# Patient Record
Sex: Female | Born: 1980 | Race: White | Hispanic: No | Marital: Married | Smoking: Never smoker
Health system: Southern US, Community
[De-identification: ages and names within clinical notes are randomized; demographics above are authoritative.]

## PROBLEM LIST (undated history)

## (undated) DIAGNOSIS — Z8619 Personal history of other infectious and parasitic diseases: Secondary | ICD-10-CM

## (undated) DIAGNOSIS — N399 Disorder of urinary system, unspecified: Secondary | ICD-10-CM

## (undated) DIAGNOSIS — A048 Other specified bacterial intestinal infections: Secondary | ICD-10-CM

## (undated) HISTORY — DX: Other specified bacterial intestinal infections: A04.8

## (undated) HISTORY — DX: Personal history of other infectious and parasitic diseases: Z86.19

## (undated) HISTORY — PX: WISDOM TOOTH EXTRACTION: SHX21

---

## 2014-09-28 ENCOUNTER — Ambulatory Visit: Payer: Self-pay | Admitting: Unknown Physician Specialty

## 2014-09-30 ENCOUNTER — Ambulatory Visit: Payer: Self-pay | Admitting: Unknown Physician Specialty

## 2014-12-06 ENCOUNTER — Encounter (HOSPITAL_COMMUNITY): Payer: Self-pay

## 2014-12-06 ENCOUNTER — Emergency Department (HOSPITAL_COMMUNITY)
Admission: EM | Admit: 2014-12-06 | Discharge: 2014-12-06 | Disposition: A | Discharge status: Home / Self Care | Payer: BLUE CROSS/BLUE SHIELD | Source: Home / Self Care | Attending: Emergency Medicine | Admitting: Emergency Medicine

## 2014-12-06 DIAGNOSIS — R300 Dysuria: Secondary | ICD-10-CM

## 2014-12-06 DIAGNOSIS — R350 Frequency of micturition: Secondary | ICD-10-CM

## 2014-12-06 HISTORY — DX: Disorder of urinary system, unspecified: N39.9

## 2014-12-06 LAB — POCT URINALYSIS DIP (DEVICE)
BILIRUBIN URINE: NEGATIVE
Glucose, UA: NEGATIVE mg/dL
LEUKOCYTES UA: NEGATIVE
Nitrite: NEGATIVE
PH: 6 (ref 5.0–8.0)
PROTEIN: NEGATIVE mg/dL
Urobilinogen, UA: 0.2 mg/dL (ref 0.0–1.0)

## 2014-12-06 MED ORDER — CEPHALEXIN 500 MG PO CAPS: 500.0000 mg | ORAL_CAPSULE | Freq: Four times a day (QID) | ORAL | Status: DC

## 2014-12-06 NOTE — Discharge Instructions (Signed)
Dysuria °Dysuria is the medical term for pain with urination. There are many causes for dysuria, but urinary tract infection is the most common. If a urinalysis was performed it can show that there is a urinary tract infection. A urine culture confirms that you or your child is sick. You will need to follow up with a healthcare provider because: °· If a urine culture was done you will need to know the culture results and treatment recommendations. °· If the urine culture was positive, you or your child will need to be put on antibiotics or know if the antibiotics prescribed are the right antibiotics for your urinary tract infection. °· If the urine culture is negative (no urinary tract infection), then other causes may need to be explored or antibiotics need to be stopped. °Today laboratory work may have been done and there does not seem to be an infection. If cultures were done they will take at least 24 to 48 hours to be completed. °Today x-rays may have been taken and they read as normal. No cause can be found for the problems. The x-rays may be re-read by a radiologist and you will be contacted if additional findings are made. °You or your child may have been put on medications to help with this problem until you can see your primary caregiver. If the problems get better, see your primary caregiver if the problems return. If you were given antibiotics (medications which kill germs), take all of the mediations as directed for the full course of treatment.  °If laboratory work was done, you need to find the results. Leave a telephone number where you can be reached. If this is not possible, make sure you find out how you are to get test results. °HOME CARE INSTRUCTIONS  °· Drink lots of fluids. For adults, drink eight, 8 ounce glasses of clear juice or water a day. For children, replace fluids as suggested by your caregiver. °· Empty the bladder often. Avoid holding urine for long periods of time. °· After a bowel  movement, women should cleanse front to back, using each tissue only once. °· Empty your bladder before and after sexual intercourse. °· Take all the medicine given to you until it is gone. You may feel better in a few days, but TAKE ALL MEDICINE. °· Avoid caffeine, tea, alcohol and carbonated beverages, because they tend to irritate the bladder. °· In men, alcohol may irritate the prostate. °· Only take over-the-counter or prescription medicines for pain, discomfort, or fever as directed by your caregiver. °· If your caregiver has given you a follow-up appointment, it is very important to keep that appointment. Not keeping the appointment could result in a chronic or permanent injury, pain, and disability. If there is any problem keeping the appointment, you must call back to this facility for assistance. °SEEK IMMEDIATE MEDICAL CARE IF:  °· Back pain develops. °· A fever develops. °· There is nausea (feeling sick to your stomach) or vomiting (throwing up). °· Problems are no better with medications or are getting worse. °MAKE SURE YOU:  °· Understand these instructions. °· Will watch your condition. °· Will get help right away if you are not doing well or get worse. °Document Released: 05/11/2004 Document Revised: 11/05/2011 Document Reviewed: 03/18/2008 °[employer]® Patient Information ©2015 [employer]. This information is not intended to replace advice given to you by your health care provider. Make sure you discuss any questions you have with your health care provider. ° °Urinary Frequency °The   number of times a normal person urinates depends upon how much liquid they take in and how much liquid they are losing. If the temperature is hot and there is high humidity, then the person will sweat more and usually breathe a little more frequently. These factors decrease the amount of frequency of urination that would be considered normal. The amount you drink is easily determined, but the amount of fluid lost is  sometimes more difficult to calculate.  Fluid is lost in two ways:  Sensible fluid loss is usually measured by the amount of urine that you get rid of. Losses of fluid can also occur with diarrhea.  Insensible fluid loss is more difficult to measure. It is caused by evaporation. Insensible loss of fluid occurs through breathing and sweating. It usually ranges from a little less than a quart to a little more than a quart of fluid a day. In normal temperatures and activity levels, the average person may urinate 4 to 7 times in a 24-hour period. Needing to urinate more often than that could indicate a problem. If one urinates 4 to 7 times in 24 hours and has large volumes each time, that could indicate a different problem from one who urinates 4 to 7 times a day and has small volumes. The time of urinating is also important. Most urinating should be done during the waking hours. Getting up at night to urinate frequently can indicate some problems. CAUSES  The bladder is the organ in your lower abdomen that holds urine. Like a balloon, it swells some as it fills up. Your nerves sense this and tell you it is time to head for the bathroom. There are a number of reasons that you might feel the need to urinate more often than usual. They include:  Urinary tract infection. This is usually associated with other signs such as burning when you urinate.  In men, problems with the prostate (a walnut-size gland that is located near the tube that carries urine out of your body). There are two reasons why the prostate can cause an increased frequency of urination:  An enlarged prostate that does not let the bladder empty well. If the bladder only half empties when you urinate, then it only has half the capacity to fill before you have to urinate again.  The nerves in the bladder become more hypersensitive with an increased size of the prostate even if the bladder empties completely.  Pregnancy.  Obesity. Excess  weight is more likely to cause a problem for women than for men.  Bladder stones or other bladder problems.  Caffeine.  Alcohol.  Medications. For example, drugs that help the body get rid of extra fluid (diuretics) increase urine production. Some other medicines must be taken with lots of fluids.  Muscle or nerve weakness. This might be the result of a spinal cord injury, a stroke, multiple sclerosis, or Parkinson disease.  Long-standing diabetes can decrease the sensation of the bladder. This loss of sensation makes it harder to sense the bladder needs to be emptied. Over a period of years, the bladder is stretched out by constant overfilling. This weakens the bladder muscles so that the bladder does not empty well and has less capacity to fill with new urine.  Interstitial cystitis (also called painful bladder syndrome). This condition develops because the tissues that line the inside of the bladder are inflamed (inflammation is the body's way of reacting to injury or infection). It causes pain and frequent urination. It  occurs in women more often than in men. DIAGNOSIS   To decide what might be causing your urinary frequency, your health care provider will probably:  Ask about symptoms you have noticed.  Ask about your overall health. This will include questions about any medications you are taking.  Do a physical examination.  Order some tests. These might include:  A blood test to check for diabetes or other health issues that could be contributing to the problem.  Urine testing. This could measure the flow of urine and the pressure on the bladder.  A test of your neurological system (the brain, spinal cord, and nerves). This is the system that senses the need to urinate.  A bladder test to check whether it is emptying completely when you urinate.  Cystoscopy. This test uses a thin tube with a tiny camera on it. It offers a look inside your urethra and bladder to see if there  are problems.  Imaging tests. You might be given a contrast dye and then asked to urinate. X-rays are taken to see how your bladder is working. TREATMENT  It is important for you to be evaluated to determine if the amount or frequency that you have is unusual or abnormal. If it is found to be abnormal, the cause should be determined and this can usually be found out easily. Depending upon the cause, treatment could include medication, stimulation of the nerves, or surgery. There are not too many things that you can do as an individual to change your urinary frequency. It is important that you balance the amount of fluid intake needed to compensate for your activity and the temperature. Medical problems will be diagnosed and taken care of by your physician. There is no particular bladder training such as Kegel exercises that you can do to help urinary frequency. This is an exercise that is usually recommended for people who have leaking of urine when they laugh, cough, or sneeze. HOME CARE INSTRUCTIONS   Take any medications your health care provider prescribed or suggested. Follow the directions carefully.  Practice any lifestyle changes that are recommended. These might include:  Drinking less fluid or drinking at different times of the day. If you need to urinate often during the night, for example, you may need to stop drinking fluids early in the evening.  Cutting down on caffeine or alcohol. They both can make you need to urinate more often than normal. Caffeine is found in coffee, tea, and sodas.  Losing weight, if that is recommended.  Keep a journal or a log. You might be asked to record how much you drink and when and where you feel the need to urinate. This will also help evaluate how well the treatment provided by your physician is working. SEEK MEDICAL CARE IF:   Your need to urinate often gets worse.  You feel increased pain or irritation when you urinate.  You notice blood in  your urine.  You have questions about any medications that your health care provider recommended.  You notice blood, pus, or swelling at the site of any test or treatment procedure.  You develop a fever of more than 100.31F (38.1C). SEEK IMMEDIATE MEDICAL CARE IF:  You develop a fever of more than 102.66F (38.9C). Document Released: 06/09/2009 Document Revised: 12/28/2013 Document Reviewed: 06/09/2009 [facility] Patient Information 2015 [place], [place]. This information is not intended to replace advice given to you by your health care provider. Make sure you discuss any questions you have with  your health care provider.  Interstitial Cystitis Interstitial cystitis (IC) is a condition that results in discomfort or pain in the bladder and the surrounding pelvic region. The symptoms can be different from case to case and even in the same individual. People may experience:  Mild discomfort.  Pressure.  Tenderness.  Intense pain in the bladder and pelvic area. CAUSES  Because IC varies so much in symptoms and severity, people studying this disease believe it is not one but several diseases. Some caregivers use the term painful bladder syndrome (PBS) to describe cases with painful urinary symptoms. This may not meet the strictest definition of IC. The term IC / PBS includes all cases of urinary pain that cannot be connected to other causes, such as infection or urinary stones.  SYMPTOMS  Symptoms may include:  An urgent need to urinate.  A frequent need to urinate.  A combination of these symptoms. Pain may change in intensity as the bladder fills with urine or as it empties. Women's symptoms often get worse during menstruation. They may sometimes experience pain with vaginal intercourse. Some of the symptoms of IC / PBS seem like those of bacterial infection. Tests do not show infection. IC / PBS is far more common in women than in men.  DIAGNOSIS  The diagnosis of IC / PBS is based  on:  Presence of pain related to the bladder, usually along with problems of frequency and urgency.  Not finding other diseases that could cause the symptoms.  Diagnostic tests that help rule out other diseases include:  Urinalysis.  Urine culture.  Cystoscopy.  Biopsy of the bladder wall.  Distension of the bladder under anesthesia.  Urine cytology.  Laboratory examination of prostate secretions. A biopsy is a tissue sample that can be looked at under a microscope. Samples of the bladder and urethra may be removed during a cystoscopy. A biopsy helps rule out bladder cancer. TREATMENT  Scientists have not yet found a cure for IC / PBS. Patients with IC / PBS do not get better with antibiotic therapy. Caregivers cannot predict who will respond best to which treatment. Symptoms may disappear without explanation. Disappearing symptoms may coincide with an event such as a change in diet or treatment. Even when symptoms disappear, they may return after days, weeks, months, or years.  Because the causes of IC / PBS are unknown, current treatments are aimed at relieving symptoms. Many people are helped by one or a combination of the treatments. As researchers learn more about IC / PBS, the list of potential treatments will change. Patients should discuss their options with a caregiver. SURGERY  Surgery should be considered only if all available treatments have failed and the pain is disabling. Many approaches and techniques are used. Each approach has its own advantages and complications. Advantages and complications should be discussed with a urologist. Your caregiver may recommend consulting another urologist for a second opinion. Most caregivers are reluctant to operate because the outcome is unpredictable. Some people still have symptoms after surgery.  People considering surgery should discuss the potential risks and benefits, side effects, and long- and short-term complications with their  family, as well as with people who have already had the procedure. Surgery requires anesthesia, hospitalization, and in some cases weeks or months of recovery. As the complexity of the procedure increases, so do the chances for complications and for failure. HOME CARE INSTRUCTIONS   All drugs, even those sold over the counter, have side effects. Patients should always  consult a caregiver before using any drug for an extended amount of time. Only take over-the-counter or prescription medicines for pain, discomfort, or fever as directed by your caregiver.  Many patients feel that smoking makes their symptoms worse. How the by-products of tobacco that are excreted in the urine affect IC / PBS is unknown. Smoking is the major known cause of bladder cancer. One of the best things smokers can do for their bladder and their overall health is to quit.  Many patients feel that gentle stretching exercises help relieve IC / PBS symptoms.  Methods vary, but basically patients decide to empty their bladder at designated times and use relaxation techniques and distractions to keep to the schedule. Gradually, patients try to lengthen the time between scheduled voids. A diary in which to record voiding times is usually helpful in keeping track of progress. MAKE SURE YOU:   Understand these instructions.  Will watch your condition.  Will get help right away if you are not doing well or get worse. Document Released: 04/13/2004 Document Revised: 11/05/2011 Document Reviewed: 06/28/2008 [facility] Patient Information 2015 [place], [place]. This information is not intended to replace advice given to you by your health care provider. Make sure you discuss any questions you have with your health care provider.

## 2014-12-06 NOTE — ED Provider Notes (Signed)
CSN: [id number]     Arrival date & time 12/06/14  1322 History   First MD Initiated Contact with Patient 12/06/14 1518     Chief Complaint  Patient presents with  . Urinary Tract Infection   (Consider location/radiation/quality/duration/timing/severity/associated sxs/prior Treatment) HPI Comments: 34 year old females complaining of dysuria, frequency and urgency that began this morning. She states she has had multiple UTIs, well over 100. She states that each time that her doctor sees blood in the urine and she has a symptom she is treated with antibiotics. She also notes that her cultures are usually negative. Patient is pregnant.   Past Medical History  Diagnosis Date  . Urinary tract disease    History reviewed. No pertinent past surgical history. History reviewed. No pertinent family history. History  Substance Use Topics  . Smoking status: Never Smoker   . Smokeless tobacco: Not on file  . Alcohol Use: Yes   OB History    Gravida Para Term Preterm AB TAB SAB Ectopic Multiple Living   1              Review of Systems  Constitutional: Negative.   Cardiovascular: Negative.   Gastrointestinal: Negative.   Genitourinary: Positive for dysuria, urgency and frequency. Negative for flank pain.  Skin: Negative.   All other systems reviewed and are negative.   Allergies  Review of patient's allergies indicates no known allergies.  Home Medications   Prior to Admission medications   Medication Sig Start Date End Date Taking? Authorizing Provider  multivitamin prental (TRINATAL) 60-1 MG TABS tablet Take 1 tablet by mouth daily.   Yes Historical Provider, MD  cephALEXin (KEFLEX) 500 MG capsule Take 1 capsule (500 mg total) by mouth 4 (four) times daily. 12/06/14   Hayden Rasmussenavid Janathan Bribiesca, NP   BP 111/73 mmHg  Pulse 78  Temp(Src) 98 F (36.7 C) (Oral)  Resp 12  SpO2 100%  LMP 10/15/2014 Physical Exam  Constitutional: She is oriented to person, place, and time. She appears  well-developed and well-nourished. No distress.  Pulmonary/Chest: Effort normal. No respiratory distress.  Abdominal: Soft. Bowel sounds are normal.  Neurological: She is alert and oriented to person, place, and time.  Skin: Skin is warm and dry.  Psychiatric: She has a normal mood and affect.  Nursing note and vitals reviewed.   ED Course  Procedures (including critical care time) Labs Review Labs Reviewed  POCT URINALYSIS DIP (DEVICE) - Abnormal; Notable for the following:    Ketones, ur TRACE (*)    Hgb urine dipstick MODERATE (*)    All other components within normal limits  URINE CULTURE   Results for orders placed or performed during the hospital encounter of 12/06/14  POCT urinalysis dip (device)  Result Value Ref Range   Glucose, UA NEGATIVE NEGATIVE mg/dL   Bilirubin Urine NEGATIVE NEGATIVE   Ketones, ur TRACE (A) NEGATIVE mg/dL   Specific Gravity, Urine >=1.030 1.005 - 1.030   Hgb urine dipstick MODERATE (A) NEGATIVE   pH 6.0 5.0 - 8.0   Protein, ur NEGATIVE NEGATIVE mg/dL   Urobilinogen, UA 0.2 0.0 - 1.0 mg/dL   Nitrite NEGATIVE NEGATIVE   Leukocytes, UA NEGATIVE NEGATIVE     Imaging Review No results found.   MDM   1. Dysuria   2. Urinary frequency    Keflex Urine culture pending. Recommend pt see urologist. Other consideration is IC    Hayden Rasmussenavid Courtni Balash, NP 12/06/14 1621

## 2014-12-06 NOTE — ED Notes (Signed)
Concern for another UTI; reported history of frequent UTI since onset puberty

## 2014-12-08 LAB — URINE CULTURE
Colony Count: 50000
Special Requests: NORMAL

## 2014-12-08 NOTE — ED Notes (Addendum)
Urine culture: 50,000 colonies Lactobacillus.  Pt. adequately treated with Keflex. Message to Hayden Rasmussenavid Mabe NP. Vassie MoselleYork, Askia Hazelip M 12/08/2014 He wrote "possible contaminant"- should be fine. 12/13/2014

## 2014-12-29 LAB — OB RESULTS CONSOLE ABO/RH: RH Type: POSITIVE

## 2014-12-29 LAB — OB RESULTS CONSOLE RPR: RPR: NONREACTIVE

## 2014-12-29 LAB — OB RESULTS CONSOLE ANTIBODY SCREEN: Antibody Screen: NEGATIVE

## 2014-12-29 LAB — OB RESULTS CONSOLE GC/CHLAMYDIA
Chlamydia: NEGATIVE
Gonorrhea: NEGATIVE

## 2014-12-29 LAB — OB RESULTS CONSOLE HIV ANTIBODY (ROUTINE TESTING): HIV: NONREACTIVE

## 2014-12-29 LAB — OB RESULTS CONSOLE HEPATITIS B SURFACE ANTIGEN: HEP B S AG: NEGATIVE

## 2014-12-29 LAB — OB RESULTS CONSOLE RUBELLA ANTIBODY, IGM: Rubella: IMMUNE

## 2015-06-06 ENCOUNTER — Telehealth (HOSPITAL_COMMUNITY): Payer: Self-pay | Admitting: *Deleted

## 2015-06-06 ENCOUNTER — Encounter (HOSPITAL_COMMUNITY): Payer: Self-pay | Admitting: *Deleted

## 2015-06-06 LAB — OB RESULTS CONSOLE GBS: GBS: NEGATIVE

## 2015-06-06 NOTE — Telephone Encounter (Signed)
Preadmission screen  

## 2015-06-07 ENCOUNTER — Other Ambulatory Visit: Payer: Self-pay | Admitting: Obstetrics and Gynecology

## 2015-06-08 ENCOUNTER — Encounter (HOSPITAL_COMMUNITY): Payer: Self-pay

## 2015-06-08 ENCOUNTER — Inpatient Hospital Stay (HOSPITAL_COMMUNITY): Payer: BLUE CROSS/BLUE SHIELD | Admitting: Anesthesiology

## 2015-06-08 ENCOUNTER — Inpatient Hospital Stay (HOSPITAL_COMMUNITY)
Admission: RE | Admit: 2015-06-08 | Discharge: 2015-06-10 | DRG: 775 | Disposition: A | Discharge status: Home / Self Care | Payer: BLUE CROSS/BLUE SHIELD | Source: Ambulatory Visit | Attending: Obstetrics and Gynecology | Admitting: Obstetrics and Gynecology

## 2015-06-08 VITALS — BP 116/79 | HR 80 | Temp 97.8°F | Resp 16 | Ht 63.5 in | Wt 129.8 lb

## 2015-06-08 DIAGNOSIS — O365930 Maternal care for other known or suspected poor fetal growth, third trimester, not applicable or unspecified: Principal | ICD-10-CM | POA: Diagnosis present

## 2015-06-08 DIAGNOSIS — Z3490 Encounter for supervision of normal pregnancy, unspecified, unspecified trimester: Secondary | ICD-10-CM

## 2015-06-08 DIAGNOSIS — Z3A39 39 weeks gestation of pregnancy: Secondary | ICD-10-CM

## 2015-06-08 LAB — CBC
HCT: 36.3 % (ref 36.0–46.0)
Hemoglobin: 12.6 g/dL (ref 12.0–15.0)
MCH: 31.1 pg (ref 26.0–34.0)
MCHC: 34.7 g/dL (ref 30.0–36.0)
MCV: 89.6 fL (ref 78.0–100.0)
PLATELETS: 232 10*3/uL (ref 150–400)
RBC: 4.05 MIL/uL (ref 3.87–5.11)
RDW: 12.6 % (ref 11.5–15.5)
WBC: 10.4 10*3/uL (ref 4.0–10.5)

## 2015-06-08 LAB — TYPE AND SCREEN
ABO/RH(D): A POS
Antibody Screen: NEGATIVE

## 2015-06-08 LAB — RPR: RPR: NONREACTIVE

## 2015-06-08 LAB — ABO/RH: ABO/RH(D): A POS

## 2015-06-08 MED ORDER — EPHEDRINE 5 MG/ML INJ: 10.0000 mg | INTRAVENOUS | Status: DC | PRN

## 2015-06-08 MED ORDER — SIMETHICONE 80 MG PO CHEW: 80.0000 mg | CHEWABLE_TABLET | ORAL | Status: DC | PRN

## 2015-06-08 MED ORDER — OXYCODONE-ACETAMINOPHEN 5-325 MG PO TABS: 2.0000 | ORAL_TABLET | ORAL | Status: DC | PRN

## 2015-06-08 MED ORDER — LIDOCAINE HCL (PF) 1 % IJ SOLN: 30.0000 mL | INTRAMUSCULAR | Status: DC | PRN

## 2015-06-08 MED ORDER — OXYTOCIN 40 UNITS IN LACTATED RINGERS INFUSION - SIMPLE MED: 62.5000 mL/h | INTRAVENOUS | Status: AC

## 2015-06-08 MED ORDER — OXYTOCIN BOLUS FROM INFUSION
500.0000 mL | INTRAVENOUS | Status: DC
  Administered 2015-06-08: 500 mL via INTRAVENOUS

## 2015-06-08 MED ORDER — PHENYLEPHRINE 40 MCG/ML (10ML) SYRINGE FOR IV PUSH (FOR BLOOD PRESSURE SUPPORT)
80.0000 ug | PREFILLED_SYRINGE | INTRAVENOUS | Status: DC | PRN
  Filled 2015-06-08: qty 20

## 2015-06-08 MED ORDER — OXYCODONE-ACETAMINOPHEN 5-325 MG PO TABS: 1.0000 | ORAL_TABLET | ORAL | Status: DC | PRN

## 2015-06-08 MED ORDER — ONDANSETRON HCL 4 MG PO TABS: 4.0000 mg | ORAL_TABLET | ORAL | Status: DC | PRN

## 2015-06-08 MED ORDER — DIBUCAINE 1 % RE OINT: 1.0000 "application " | TOPICAL_OINTMENT | RECTAL | Status: DC | PRN

## 2015-06-08 MED ORDER — LACTATED RINGERS IV SOLN: INTRAVENOUS | Status: AC

## 2015-06-08 MED ORDER — ONDANSETRON HCL 4 MG/2ML IJ SOLN: 4.0000 mg | INTRAMUSCULAR | Status: DC | PRN

## 2015-06-08 MED ORDER — LIDOCAINE HCL (PF) 1 % IJ SOLN
INTRAMUSCULAR | Status: DC | PRN
  Administered 2015-06-08 (×2): 4 mL

## 2015-06-08 MED ORDER — DIPHENHYDRAMINE HCL 25 MG PO CAPS
25.0000 mg | ORAL_CAPSULE | Freq: Four times a day (QID) | ORAL | Status: DC | PRN
  Administered 2015-06-10: 25 mg via ORAL
  Filled 2015-06-08: qty 1

## 2015-06-08 MED ORDER — ZOLPIDEM TARTRATE 5 MG PO TABS: 5.0000 mg | ORAL_TABLET | Freq: Every evening | ORAL | Status: DC | PRN

## 2015-06-08 MED ORDER — FENTANYL 2.5 MCG/ML BUPIVACAINE 1/10 % EPIDURAL INFUSION (WH - ANES)
14.0000 mL/h | INTRAMUSCULAR | Status: DC | PRN
  Administered 2015-06-08 (×2): 14 mL/h via EPIDURAL
  Filled 2015-06-08: qty 125

## 2015-06-08 MED ORDER — SENNOSIDES-DOCUSATE SODIUM 8.6-50 MG PO TABS
2.0000 | ORAL_TABLET | ORAL | Status: DC
  Administered 2015-06-08 – 2015-06-10 (×2): 2 via ORAL
  Filled 2015-06-08 (×2): qty 2

## 2015-06-08 MED ORDER — PRENATAL MULTIVITAMIN CH
1.0000 | ORAL_TABLET | Freq: Every day | ORAL | Status: DC
  Administered 2015-06-09 – 2015-06-10 (×2): 1 via ORAL
  Filled 2015-06-08 (×2): qty 1

## 2015-06-08 MED ORDER — LACTATED RINGERS IV SOLN
INTRAVENOUS | Status: DC
  Administered 2015-06-08: 125 mL/h via INTRAVENOUS

## 2015-06-08 MED ORDER — OXYTOCIN 40 UNITS IN LACTATED RINGERS INFUSION - SIMPLE MED
1.0000 m[IU]/min | INTRAVENOUS | Status: DC
  Administered 2015-06-08: 1 m[IU]/min via INTRAVENOUS
  Filled 2015-06-08: qty 1000

## 2015-06-08 MED ORDER — DIPHENHYDRAMINE HCL 50 MG/ML IJ SOLN: 12.5000 mg | INTRAMUSCULAR | Status: DC | PRN

## 2015-06-08 MED ORDER — WITCH HAZEL-GLYCERIN EX PADS: 1.0000 "application " | MEDICATED_PAD | CUTANEOUS | Status: DC | PRN

## 2015-06-08 MED ORDER — LANOLIN HYDROUS EX OINT: TOPICAL_OINTMENT | CUTANEOUS | Status: DC | PRN

## 2015-06-08 MED ORDER — BENZOCAINE-MENTHOL 20-0.5 % EX AERO
1.0000 "application " | INHALATION_SPRAY | CUTANEOUS | Status: DC | PRN
  Administered 2015-06-08: 1 via TOPICAL
  Filled 2015-06-08: qty 56

## 2015-06-08 MED ORDER — TETANUS-DIPHTH-ACELL PERTUSSIS 5-2.5-18.5 LF-MCG/0.5 IM SUSP: 0.5000 mL | Freq: Once | INTRAMUSCULAR | Status: DC

## 2015-06-08 MED ORDER — ACETAMINOPHEN 325 MG PO TABS: 650.0000 mg | ORAL_TABLET | ORAL | Status: DC | PRN

## 2015-06-08 MED ORDER — SODIUM CHLORIDE 0.9 % IV SOLN: 14.0000 mL/h | INTRAVENOUS | Status: DC | PRN

## 2015-06-08 MED ORDER — OXYTOCIN 40 UNITS IN LACTATED RINGERS INFUSION - SIMPLE MED
62.5000 mL/h | INTRAVENOUS | Status: DC
Start: 2015-06-08 — End: 2015-06-08

## 2015-06-08 MED ORDER — IBUPROFEN 600 MG PO TABS
600.0000 mg | ORAL_TABLET | Freq: Four times a day (QID) | ORAL | Status: DC
  Administered 2015-06-08 – 2015-06-10 (×7): 600 mg via ORAL
  Filled 2015-06-08 (×7): qty 1

## 2015-06-08 MED ORDER — MEASLES, MUMPS & RUBELLA VAC ~~LOC~~ INJ: 0.5000 mL | INJECTION | Freq: Once | SUBCUTANEOUS | Status: DC

## 2015-06-08 MED ORDER — PRENATAL MULTIVITAMIN CH: 1.0000 | ORAL_TABLET | Freq: Every day | ORAL | Status: DC

## 2015-06-08 MED ORDER — LACTATED RINGERS IV SOLN
500.0000 mL | INTRAVENOUS | Status: DC | PRN
  Administered 2015-06-08: 500 mL via INTRAVENOUS

## 2015-06-08 NOTE — Progress Notes (Signed)
Patient ID: Monica [name], female   DOB: 09/23/1980, 34 y.o.   MRN: [id number] Pitocin is at 3 mu/minute Contractions are q 3-5 minutes and are more painful. Cervix is 2 + cm 70 % effaced and the vertex is at - 2 station. AROM produced clear fluid.

## 2015-06-08 NOTE — Anesthesia Procedure Notes (Signed)
Epidural Patient location during procedure: OB  Staffing Anesthesiologist: Kitai Purdom Performed by: anesthesiologist   Preanesthetic Checklist Completed: patient identified, site marked, surgical consent, pre-op evaluation, timeout performed, IV checked, risks and benefits discussed and monitors and equipment checked  Epidural Patient position: sitting Prep: site prepped and draped and DuraPrep Patient monitoring: continuous pulse ox and blood pressure Approach: midline Location: L3-L4 Injection technique: LOR saline  Needle:  Needle type: Tuohy  Needle gauge: 17 G Needle length: 9 cm and 9 Needle insertion depth: 6 cm Catheter type: closed end flexible Catheter size: 19 Gauge Catheter at skin depth: 10 cm Test dose: negative  Assessment Events: blood not aspirated, injection not painful, no injection resistance, negative IV test and no paresthesia  Additional Notes Patient identified. Risks/Benefits/Options discussed with patient including but not limited to bleeding, infection, nerve damage, paralysis, failed block, incomplete pain control, headache, blood pressure changes, nausea, vomiting, reactions to medication both or allergic, itching and postpartum back pain. Confirmed with bedside nurse the patient's most recent platelet count. Confirmed with patient that they are not currently taking any anticoagulation, have any bleeding history or any family history of bleeding disorders. Patient expressed understanding and wished to proceed. All questions were answered. Sterile technique was used throughout the entire procedure. Please see nursing notes for vital signs. Test dose was given through epidural catheter and negative prior to continuing to dose epidural or start infusion. Warning signs of high block given to the patient including shortness of breath, tingling/numbness in hands, complete motor block, or any concerning symptoms with instructions to call for help. Patient was  given instructions on fall risk and not to get out of bed. All questions and concerns addressed with instructions to call with any issues or inadequate analgesia.    

## 2015-06-08 NOTE — Anesthesia Postprocedure Evaluation (Signed)
  Anesthesia Post-op Note  Patient: Monica MoynahanSara Elizabeth [name]  Procedure(s) Performed: * No procedures listed *  Patient Location: Mother/Baby  Anesthesia Type:Epidural  Level of Consciousness: awake, alert  and oriented  Airway and Oxygen Therapy: Patient Spontanous Breathing  Post-op Pain: none  Post-op Assessment: Post-op Vital signs reviewed and Patient's Cardiovascular Status Stable              Post-op Vital Signs: Reviewed and stable  Last Vitals:  Filed Vitals:   06/08/15 1530  BP: 120/77  Pulse: 79  Temp: 36.5 C  Resp: 17    Complications: No apparent anesthesia complications

## 2015-06-08 NOTE — Anesthesia Preprocedure Evaluation (Signed)
Anesthesia Evaluation  Patient identified by MRN, date of birth, ID band Patient awake    Reviewed: Allergy & Precautions, NPO status , Patient's Chart, lab work & pertinent test results  History of Anesthesia Complications Negative for: history of anesthetic complications  Airway Mallampati: II  TM Distance: >3 FB Neck ROM: Full    Dental no notable dental hx. (+) Dental Advisory Given   Pulmonary neg pulmonary ROS,    Pulmonary exam normal breath sounds clear to auscultation       Cardiovascular negative cardio ROS Normal cardiovascular exam Rhythm:Regular Rate:Normal     Neuro/Psych negative neurological ROS  negative psych ROS   GI/Hepatic negative GI ROS, Neg liver ROS,   Endo/Other  negative endocrine ROS  Renal/GU negative Renal ROS  negative genitourinary   Musculoskeletal negative musculoskeletal ROS (+)   Abdominal   Peds negative pediatric ROS (+)  Hematology negative hematology ROS (+)   Anesthesia Other Findings   Reproductive/Obstetrics (+) Pregnancy                             Anesthesia Physical Anesthesia Plan  ASA: II  Anesthesia Plan: Epidural   Post-op Pain Management:    Induction:   Airway Management Planned:   Additional Equipment:   Intra-op Plan:   Post-operative Plan:   Informed Consent: I have reviewed the patients History and Physical, chart, labs and discussed the procedure including the risks, benefits and alternatives for the proposed anesthesia with the patient or authorized representative who has indicated his/her understanding and acceptance.     Plan Discussed with: CRNA  Anesthesia Plan Comments:         Anesthesia Quick Evaluation  

## 2015-06-08 NOTE — Progress Notes (Signed)
Patient ID: Monica HughSara Elizabeth [name], female   DOB: 08/12/1981, 34 y.o.   MRN: [id number] Pt is admitted for induction Contractions are q 5 minutes and not painful. BP is borderline elevated. She has no headache.

## 2015-06-08 NOTE — Lactation Note (Signed)
This note was copied from the chart of Monica [name]. Lactation Consultation Note  Patient Name: Monica [name] ZOXWR'UToday's Date: 06/08/2015 Reason for consult: Initial assessment First time mom that wants to breast and formula feed. She has a latch score of 10 and has not offered formula yet. She stated she is not sure she will make milk, worried the baby may stop latching, wants FOB to feed the baby, she plans to go back to work at 6 wk, and she has a pump but thinks her job will make pumping hard. Her bf goal is 2-323m. Upon entry baby had a pacifier in his mouth. Went over artificial nipples, feeding cues/frequency, milk transition, nipple care, and breast changes. She stated that she know how to manually express, but did not demonstrate. Mom reports she has seen colostrum.  She is aware of Labor Laws. Informed family of lactation services and support group. She has no questions and will page lactation if needed.    Maternal Data Has patient been taught Hand Expression?: Yes Does the patient have breastfeeding experience prior to this delivery?: No  Feeding Feeding Type: Breast Fed Length of feed: 30 min  LATCH Score/Interventions Latch: Grasps breast easily, tongue down, lips flanged, rhythmical sucking.  Audible Swallowing: Spontaneous and intermittent  Type of Nipple: Everted at rest and after stimulation  Comfort (Breast/Nipple): Soft / non-tender     Hold (Positioning): No assistance needed to correctly position infant at breast.  LATCH Score: 10  Lactation Tools Discussed/Used     Consult Status Consult Status: PRN    Monica [name] 06/08/2015, 4:34 PM

## 2015-06-08 NOTE — Progress Notes (Signed)
Patient ID: Monica HughSara Elizabeth [name], female   DOB: 07/27/1981, 34 y.o.   MRN: [id number] Delivery note:  The pt made good progress,received an epidural and reached full dilatation. She pushed well and gradually brought the vertex to the perineum and delivered spontaneously OA a living female infant with Apgars of 9 and 9 at 1 and 5 minutes. The placenta delivered intact and the uterus was normal. There was a right labial and a left of ML second degree laceration. They were repaired with 3-0 vicryl. After the repair of the perineal laceration, I found I had sutured some right vaginal mucosa to the perineum so I removed the suture and resutured it.  EBL 300 cc's. The placenta appeared normal so was not sent to path

## 2015-06-08 NOTE — H&P (Signed)
Monica [name]:  NORLING, Barri                ACCOUNT NO.:  [id number]  MEDICAL RECORD NO.:  [id number]  LOCATION:  9172                          FACILITY:  [facility]  PHYSICIAN:  Malachi Prohomas F. Ambrose MantleHenley, M.D. DATE OF BIRTH:  23-Mar-1981  DATE OF ADMISSION:  06/08/2015 DATE OF DISCHARGE:                             HISTORY & PHYSICAL   HISTORY OF PRESENT ILLNESS:  This is a 34 year old white female, para 0, gravida 1, EDC June 12, 2015, admitted for induction of labor because of favorable cervix and probable small for gestational age.  Blood group and type A positive, negative antibody.  RPR negative.  Urine culture negative.  Hepatitis B surface antigen negative, HIV negative, GC and Chlamydia negative.  Varicella immune.  Rubella immune.  One-hour Glucola test 135.  Three-hour GTT not recommended by Dr. Jackelyn KnifeMeisinger. Repeat HIV and RPR negative.  Group B strep negative.  The patient began her prenatal course on Dec 29, 2014. Ultrasound done on December 16, 2014, [facility] 4 days[facility]  She declines screening tests.  The 18-week ultrasound was normal.  The Glucola test was 135. She was advised she could do the 3 hour if she wanted to, apparently, she chose not to.  At 32 weeks and 4 days, she measured 30.5, so an ultrasound was recommended for growth.  At 36 weeks, the baby seems somewhat small.  She was again asked if she wanted to repeat the blood glucose test and she declined.  Because of small for dates, nonstress tests were done twice a week.  She had a repeat ultrasound that suggested the baby was small, less than the 10th percentile.  Doppler studies and AFI were done.  I spoke to Dr. Harlon FlorWhitaker on May 30, 2015. He advised delivery at 39 weeks, not waiting for the estimated date of confinement.  Her last ultrasound on June 01, 2015, the baby had a normal AFI, Doppler studies were normal.  Her ultrasound on May 24, 2015, the estimated fetal weight was 5 pounds 8 ounces less  than the 10th percentile, but there was symmetrical growth.  The patient herself weighed 4 pounds 2 ounces at term, and she said that her entire family was small at birth.  The patient is admitted now for induction of labor.  PAST MEDICAL HISTORY:  No major adult illnesses.  She had wisdom teeth extracted.  ALLERGIES:  She has no known drug allergies.  No latex or food allergies.  SOCIAL HISTORY:  The patient never smoked, does not drink, denies drugs. Has post graduate education.  FAMILY HISTORY:  Mother cancer of the breast, father high blood pressure, unspecified relation on the paternal side high blood pressure.  PHYSICAL EXAMINATION:  On today's visit on June 07, 2015, VITAL SIGNS:  Blood pressure is 128/80, pulse is 72. HEART:  Normal size and sounds.  No murmurs. LUNGS:  Clear to auscultation. GU:  Fundal height is 34.5 cm.  Fetal heart tones are normal.  Cervix is 2 cm, 40%, vertex at a -2.  ADMITTING IMPRESSION:  Intrauterine pregnancy at 39 weeks and 3 days probably small for gestational age.  The patient is admitted and will be  placed on Pitocin.     Malachi Pro. Ambrose Mantle, M.D.     TFH/MEDQ  D:  06/07/2015  T:  06/07/2015  Job:  [id number]

## 2015-06-09 LAB — CBC
HCT: 33.4 % — ABNORMAL LOW (ref 36.0–46.0)
Hemoglobin: 11.4 g/dL — ABNORMAL LOW (ref 12.0–15.0)
MCH: 30.8 pg (ref 26.0–34.0)
MCHC: 34.1 g/dL (ref 30.0–36.0)
MCV: 90.3 fL (ref 78.0–100.0)
PLATELETS: 208 10*3/uL (ref 150–400)
RBC: 3.7 MIL/uL — ABNORMAL LOW (ref 3.87–5.11)
RDW: 12.9 % (ref 11.5–15.5)
WBC: 15.9 10*3/uL — AB (ref 4.0–10.5)

## 2015-06-09 NOTE — Progress Notes (Signed)
Patient ID: Monica [name], female   DOB: 09/10/1980, 34 y.o.   MRN: [id number] #1 afebrile BP normal no complaints

## 2015-06-09 NOTE — Lactation Note (Addendum)
This note was copied from the chart of Monica [name]. Lactation Consultation Note  Patient Name: Monica [name] ZOXWR'UToday's Date: 06/09/2015 Reason for consult: Follow-up assessment;Infant < 6lbs   Follow up consult with mom. Infant with 4+ attempts, 1 BF for 30 minutes 1 void, 1 stool and 3 % weight loss in since birth. Mom's nurse reports infant has been gaggy and spitting some. He had his circumcision this morning. Mom reports she is attempting to feed infant every 2-3 hours. She reports infant will latch, but them goes to sleep. Enc her to attempt to feed every 2-3 hours and to practice STS with and between feeds. Discussed with mom that it may be to his benefit if we hand express and spoon feed infant, mom wishes to wait until he is over 24 hours old. Will need to follow up this afternoon. Enc. Mom to keep I/O record as she is not currently using it, she does have it at bedside. Enc. Mom to call with questions/concerns.  Mom reported that she had minimal breast changes with pregnancy, " a little bit" of tenderness, growth, and darkening areola. She did not experience any breast leaking. She says she planned to Breast and formula feed so her husband can help feed. Discussed possibility of nipple confusion with introduction early. Pacifier was in infants crib currently.    Maternal Data Does the patient have breastfeeding experience prior to this delivery?: No  Feeding    LATCH Score/Interventions                      Lactation Tools Discussed/Used     Consult Status Consult Status: Follow-up Date: 06/09/15 Follow-up type: In-patient    Silas FloodSharon S Falisha Osment 06/09/2015, 11:19 AM

## 2015-06-09 NOTE — Lactation Note (Signed)
This note was copied from the chart of Monica Darylene PriceSara Norling. Lactation Consultation Note Follow up visit at 28 hours of age.  Baby remains sleepy and holds breast in mouth, but not sustaining suck.  Mom has easily expressed colostrum.  Spoon fed baby 6mls, baby began to extend tongue to lick off spoon at end of feeding and then back asleep.  Inttructed on use of NS and fit mom for #20.  Baby latched well with intermittent sucking and good strong jaw movement when stimulated for about 5 minutes.  Colostrum noted in nipple shield.  MOm is able to return demonstration of NS application, but baby too sleepy to latch again.  Encouraged mom to allow STS every 2 hours and feed with feeding cues on demand.  Encouraged mom to give supplement of her colostrum before or after feeding, this visit the appetizer seemed to help him.  Baby is still gaggy with high palate and and limited tongue mobility.  Encouraged mom to continue pumping to increase her milk supply and offer supplement to baby.  [facility].      Patient Name: Monica Darylene PriceSara Norling ZOXWR'UToday's Date: 06/09/2015 Reason for consult: Follow-up assessment;Difficult latch;Infant < 6lbs   Maternal Data Has patient been taught Hand Expression?: Yes Does the patient have breastfeeding experience prior to this delivery?: No  Feeding Feeding Type: Breast Fed Length of feed: 5 min  LATCH Score/Interventions Latch: Repeated attempts needed to sustain latch, nipple held in mouth throughout feeding, stimulation needed to elicit sucking reflex. Intervention(s): Skin to skin;Teach feeding cues;Waking techniques Intervention(s): Breast massage;Breast compression  Audible Swallowing: A few with stimulation Intervention(s): Skin to skin;Hand expression  Type of Nipple: Everted at rest and after stimulation  Comfort (Breast/Nipple): Soft / non-tender     Hold (Positioning): Assistance needed to correctly position infant at breast and maintain  latch. Intervention(s): Breastfeeding basics reviewed;Support Pillows;Position options;Skin to skin  LATCH Score: 7  Lactation Tools Discussed/Used Tools: Nipple Shields Nipple shield size: 20 Date initiated:: 06/09/15   Consult Status Consult Status: Follow-up Date: 06/10/15 Follow-up type: In-patient    Beverely RisenShoptaw, Arvella MerlesJana Lynn 06/09/2015, 5:31 PM

## 2015-06-10 ENCOUNTER — Ambulatory Visit: Payer: Self-pay

## 2015-06-10 MED ORDER — IBUPROFEN 600 MG PO TABS: 600.0000 mg | ORAL_TABLET | Freq: Four times a day (QID) | ORAL | Status: DC | PRN

## 2015-06-10 NOTE — Discharge Instructions (Signed)
booklet °

## 2015-06-10 NOTE — Lactation Note (Signed)
This note was copied from the chart of Monica [name]. Lactation Consultation Note  Patient Name: Monica [name] BJYNW'GToday's Date: 06/10/2015 Reason for consult: Follow-up assessment;Difficult latch;Infant weight loss;Infant < 6lbs   Follow up with mom prior to D/C. Mom had just BF infant at right breast with nipple shield. Infant was sleepy. Dad  Attempted to finger feed infant with 5 fr feeding tube and syringe. Infant gaggy and with disorganized suck and took 3 cc. Showed parents how to bottle feed infant with slow flow nipple using chin and cheek support. Infant did better with bottle once he got started. He took an additional 7 cc Neocare 22 formula. Mom has Dr. Manson PasseyBrown bottles at home and plans to use those once home. Mom did not pump this morning. She declined taking a pump home and said that she and dad want to see how the baby does tonight and then decide if they want to buy one. She has not called her insurance company about the pump yet. Advised mom this is not my recommendation and that she may not be able to initiate or maintain a milk supply and she replied "ok". Enc. Mom to call with questions/concerns and to call and set up OP appt if still using NS at 1 week.   Maternal Data Formula Feeding for Exclusion: No  Feeding Feeding Type: Breast Fed Length of feed: 10 min  LATCH Score/Interventions                      Lactation Tools Discussed/Used Tools: 5F feeding tube / Syringe;Bottle Nipple shield size: 20   Consult Status Consult Status: Complete Follow-up type: Call as needed    Monica BlalockSharon S Vora [name] 06/10/2015, 12:20 PM

## 2015-06-10 NOTE — Progress Notes (Signed)
Patient ID: Monica [name], female   DOB: 02/19/1981, 34 y.o.   MRN: [id number] #2 afebrile BP normal for d/c

## 2015-06-10 NOTE — Lactation Note (Signed)
This note was copied from the chart of Monica [name]. Lactation Consultation Note  Patient Name: Monica [name] ZOXWR'UToday's Date: 06/10/2015 Reason for consult: Follow-up assessment;Infant < 6lbs;Infant weight loss   Follow up with mom, dad, and infant. Baby noted to have 3 BF for 7-12 minutes, 7 attempts, 3 voids, 7 stools, and 8 % weight loss in last 24 hours. Mom reports he is more alert and active at feedings. She reports he had a cluster feeding spell yesterday evening and did not awaken to feed much during the night. Mom reports she is using the #20 nipple shield with each feeding. Mom has a DEBP at bedside, she reports she has not been  pumping. We discussed the need for awakening baby to feed every 3 hours and the need to post pump to initiate Lactation and to protect supply dues to NS use, sleepy baby, and weight loss. Mom BF fed infant while sitting in chair. Mom with small breasts and small nipples, nipple tissue not easily compressible or stretchable. 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 She placed NS on left breast independently and latched infant shallowly to NS. Infant bobbing on and off NS and had only 1/2 NS in mouth, encouraged mom to assist baby with obtaining a deeper latch to maximize milk transfer. Infant nursed on and off during a 45 minute period. He was examined by MD during middle of feeding. A few strong sucking burst with audible swallowing noted. Mom did voice that she noticed a difference when child pulled further into breast. Encouraged mom to massage/compress breasts during feeding, she would do so for a few strokes and stop. I encouraged mom to pump and hand express post feed, she declined saying "not if he is feeding like this". We again discussed supply and demand, potential for decreased supply and milk transfer related to NS use, sleepy baby, and infants weight loss and need to pump to stimulate supply. She chose not to pump. Spoke with Dr. Chestine Sporelark and gave him an  update, who spoke with family. Decision was made to supplement infant every 3 hours post BF with EBM or Neocare 22 cal formula. Formula preparation sheet with supplementation guidelines given and explaoned to family. Infant noted to have a disorganized suck when sucking on gloved finger, he would eventually get into a rhythmic sucking pattern. Worked with dad to teach finger feeding with a 5 french feeding tube and syringe, infant noted to be making a biting motion and had difficulty forming a seal to suck, I also attempted and noted the same sucking pattern. Infant better once gave chin and cheek support. Discussed with family that infant may need to be given a bottle for supplementation to be able to give chin and cheek support. Mom is to order a DEBP from [employer], encouraged her to call and order today. Gave paperwork for 2 week pump rental. Will follow up at next feeding to assess suck during supplementation prior to D/C. Advised mom that infant needs to have an OP LC consult in a week, she declined saying that she doesn't wasn't to make an appointment until after she gets home and they get in a pattern. Discussed with mom that the NS should be a temporary tool and encouraged her to try to latch infant at Breast with out it at times to be able to wean him off. Plan was to feed infant every 2-3 hours at first feeding cues or awaken at 3 hours as needed, pump with  DEBP for 15 minutes, followed by hand expression. Feed infant EBM first followed by formula amounts per guidelines by finger feeding or bottle. [facility] to [facility] morning for follow up. Will follow up at 11 am feeding to finalize D/C feeding plan.   Maternal Data Formula Feeding for Exclusion: No Has patient been taught Hand Expression?: Yes Does the patient have breastfeeding experience prior to this delivery?: No  Feeding Feeding Type: Breast Fed Length of feed: 20 min  LATCH Score/Interventions Latch: Repeated  attempts needed to sustain latch, nipple held in mouth throughout feeding, stimulation needed to elicit sucking reflex. Intervention(s): Skin to skin;Waking techniques Intervention(s): Adjust position;Assist with latch;Breast massage;Breast compression  Audible Swallowing: A few with stimulation Intervention(s): Skin to skin Intervention(s): Skin to skin;Hand expression  Type of Nipple: Everted at rest and after stimulation (small breast and nipples, breast tissue not easily compressible)  Comfort (Breast/Nipple): Filling, red/small blisters or bruises, mild/mod discomfort  Problem noted: Mild/Moderate discomfort Interventions (Mild/moderate discomfort):  (Assisted with obtaining deeper latch)  Hold (Positioning): Assistance needed to correctly position infant at breast and maintain latch. Intervention(s): Breastfeeding basics reviewed;Support Pillows;Position options;Skin to skin  LATCH Score: 6  Lactation Tools Discussed/Used Tools: Nipple Shields;36F feeding tube / Syringe Nipple shield size: 20 WIC Program: No Pump Review: Setup, frequency, and cleaning;Milk Storage   Consult Status Consult Status: PRN Follow-up type: Call as needed    Ed Blalock 06/10/2015, 9:36 AM

## 2015-06-11 NOTE — Discharge Summary (Signed)
NAMDarylene Price:  NORLING, Amenah                ACCOUNT NO.:  [id number]  MEDICAL RECORD NO.:  [id number]  LOCATION:  9132                          [facility]ILITY:  [facility]  PHYSICIAN:  Malachi Prohomas F. Ambrose MantleHenley, M.D. DATE OF BIRTH:  [birth year]  DATE OF ADMISSION:  06/08/2015 DATE OF DISCHARGE:  06/10/2015                              DISCHARGE SUMMARY   This is a 34 year old white female, para 0, gravida 1, EDC June 12, 2015, admitted for induction of labor because of favorable cervix and possible small for gestational age.  The patient had a borderline 1-hour Glucola, but did not want 3-hour testing.  Dr. Harlon FlorWhitaker advised delivering the baby prior to her due date after 39 weeks.  Patient herself weighed 4 pounds 2 ounces at birth at full term.  She was admitted for induction, was placed on Pitocin.  The blood pressure was borderline elevated, but this resolved.  By 8:57 a.m., the Pitocin was at 3 milliunits a minute.  Contractions every 3-5 minutes.  Cervix 2+ cm, 70%, vertex was at -2 station.  Artificial rupture of the membranes produced clear fluid.  The patient then received an epidural.  She made good progress and reached full dilatation.  She pushed well and gradually brought the vertex to the perineum and delivered spontaneously OA a living female infant 6 pounds 2 ounces with Apgars of 9 and 9 at 1 and 5 minutes.  Placenta delivered intact.  Uterus normal.  Right labia and left of midline second-degree laceration was repaired with 3-0 Vicryl.  After the repair of the perineal laceration, I found I had sutured some of the right vaginal mucosa to the perineum, so I removed the suture and re-sutured it.  Blood loss about 300 mL.  The placenta appeared normal.  Postpartum, the patient did well and was discharged on the second postpartum day.  Initial hemoglobin 12.6, hematocrit 36.3, white count 10,400, platelet count 232,000.  Followup hemoglobin was 11.4.  FINAL DIAGNOSES:  Intrauterine pregnancy, 39+  weeks, delivered OA, small for gestational age infant.  OPERATION:  Spontaneous delivery OA, repair of right labial and left of midline second-degree laceration.  FINAL CONDITION:  Improved.  INSTRUCTIONS:  Include our regular discharge instruction booklet as well as the after visit summary.  Prescription for Motrin 600 mg 30 tablets, 1 every 6 hours as needed for pain, and the patient is advised to return to the office in 6 weeks for followup examination.     Malachi Prohomas F. Ambrose MantleHenley, M.D.     TFH/MEDQ  D:  06/10/2015  T:  06/11/2015  Job:  [id number]

## 2015-08-27 ENCOUNTER — Inpatient Hospital Stay (HOSPITAL_COMMUNITY): Admission: AD | Admit: 2015-08-27 | Payer: Self-pay | Source: Ambulatory Visit | Admitting: Obstetrics and Gynecology

## 2016-03-13 IMAGING — RF DG UGI W/ SMALL BOWEL
10 of 11 series · 15 of 17 positions shown · non-contrast
Comparison: None.

CLINICAL DATA: Nausea and vomiting.

EXAM:
UPPER GI SERIES WITH SMALL BOWEL FOLLOW-THROUGH best that
FLUOROSCOPY TIME:  Fluoroscopy Time (in minutes and seconds): 2 min
0 seconds
Number of Acquired Images:  13
TECHNIQUE: Combined double contrast and single contrast upper GI series using
effervescent crystals, thick barium, and thin barium. Subsequently,
serial images of the small bowel were obtained including spot views
of the terminal ileum.

[Series 1: ap · 0.17mm/px · 6 of 7 slices shown]
[im 1/7]
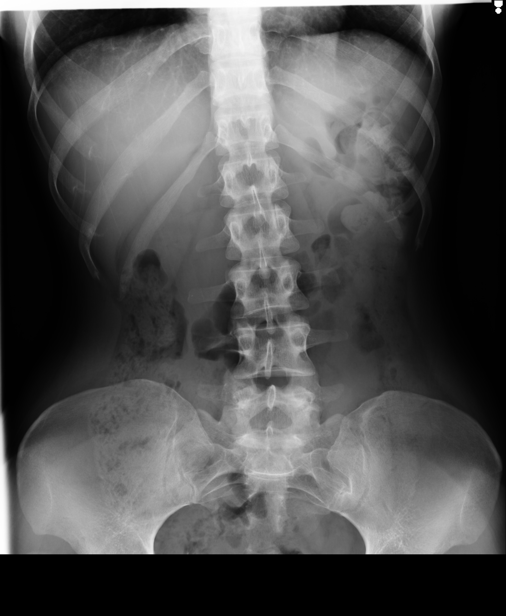
[im 2/7]
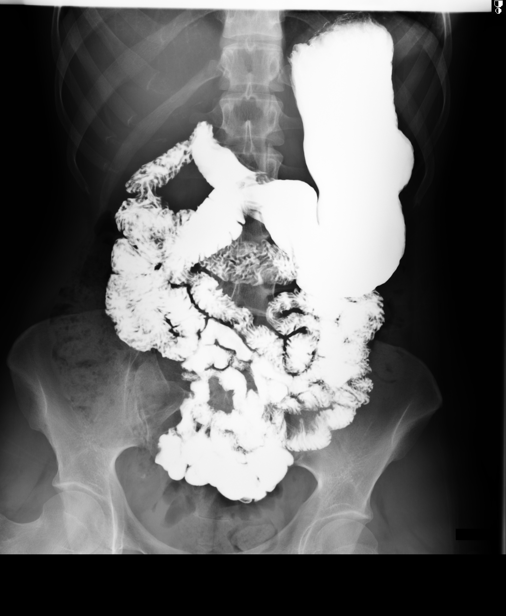
[im 3/7]
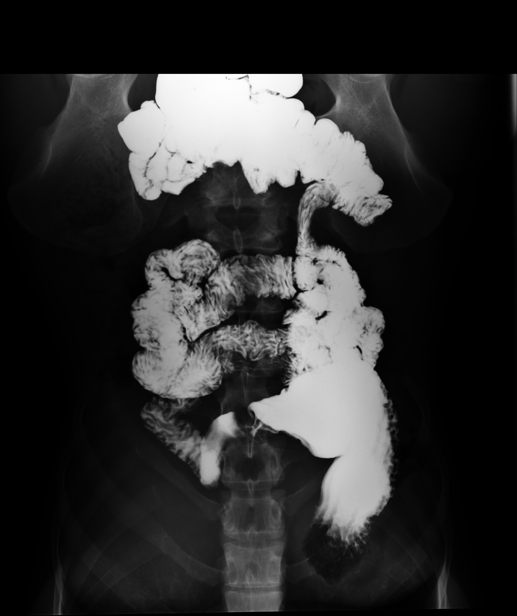
[im 4/7]
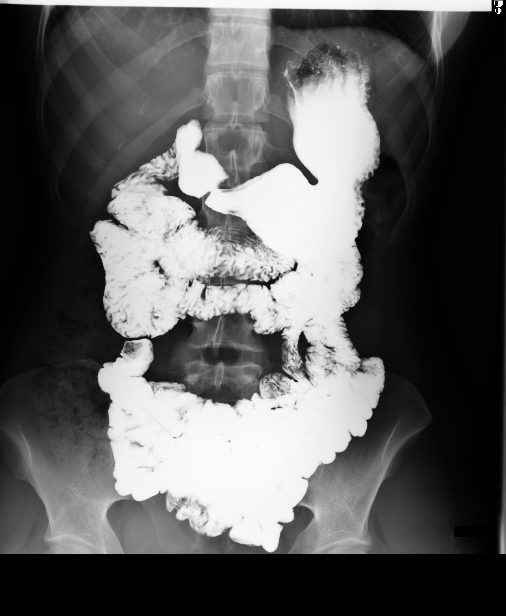
[im 6/7]
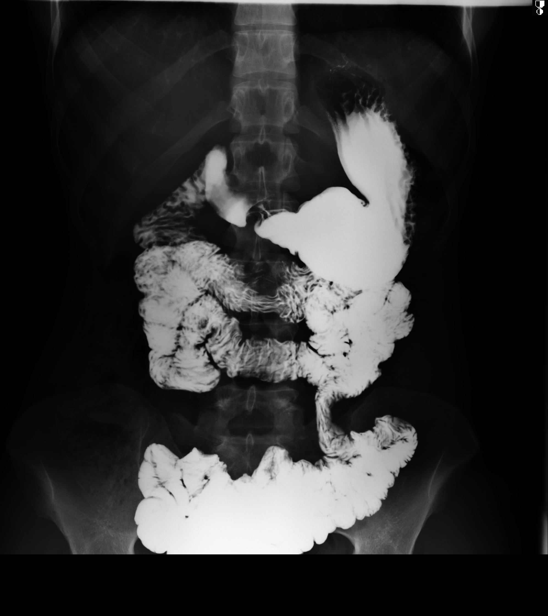
[im 7/7]
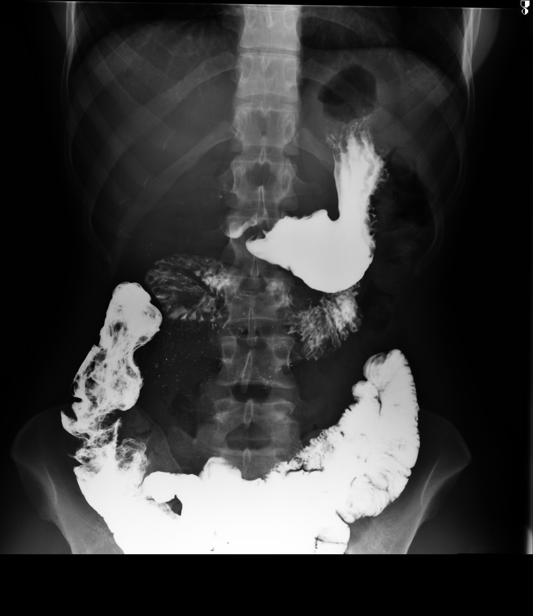

[Series 2: fluoro_barium 2fps_bw · 0.18mm/px · 1 of 1 slices shown (1 of 9)]
[im 1/1]
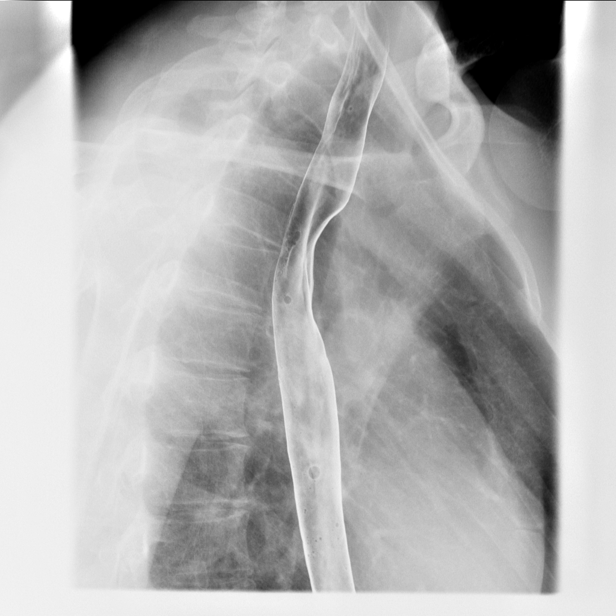

[Series 3: fluoro_barium 2fps_bw · 0.18mm/px · 1 of 1 slices shown (2 of 9)]
[im 1/1]
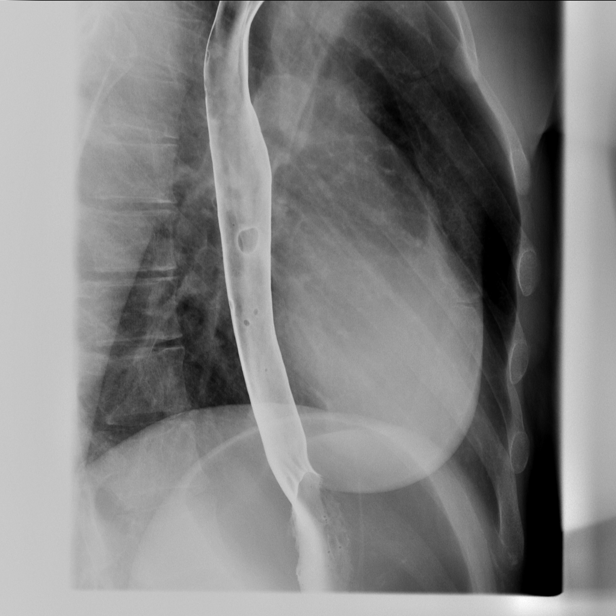

[Series 4: fluoro_barium 2fps_bw · 0.18mm/px · 1 of 1 slices shown (3 of 9)]
[im 1/1]
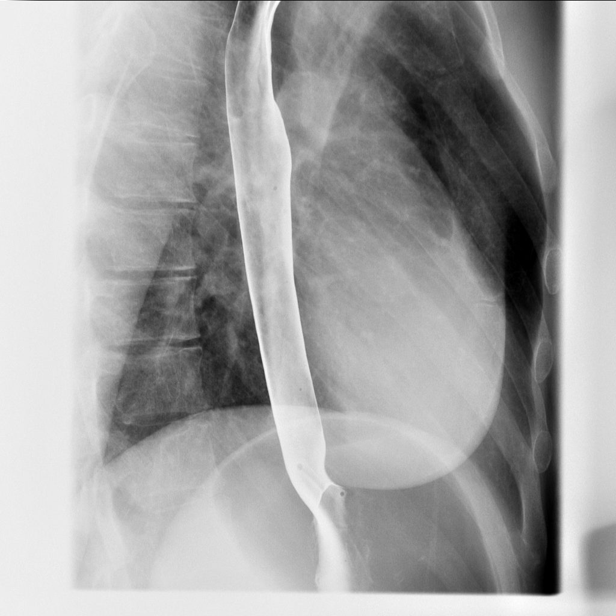

[Series 5: fluoro_barium 2fps_bw · 0.22mm/px · 1 of 1 slices shown (4 of 9)]
[im 1/1]
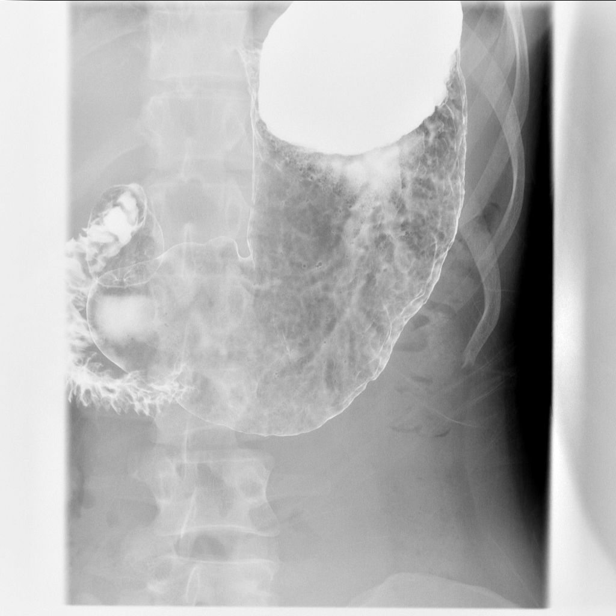

[Series 6: fluoro_barium 2fps_bw · 0.22mm/px · 1 of 1 slices shown (5 of 9)]
[im 1/1]
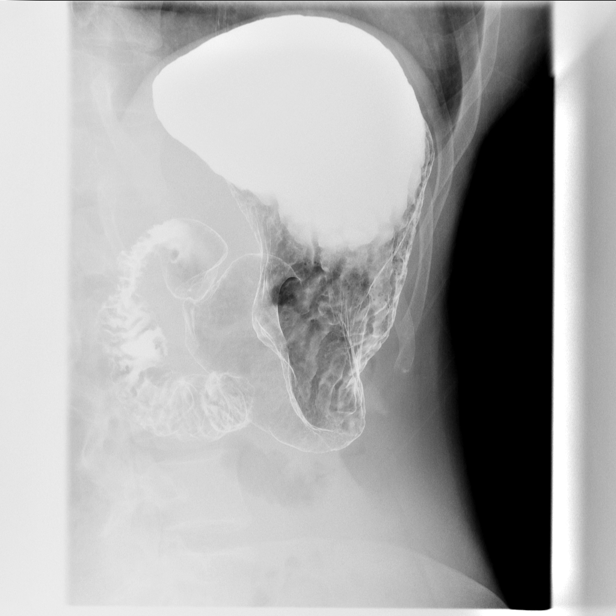

[Series 8: fluoro_barium 2fps_bw · 0.22mm/px · 1 of 1 slices shown (6 of 9)]
[im 1/1]
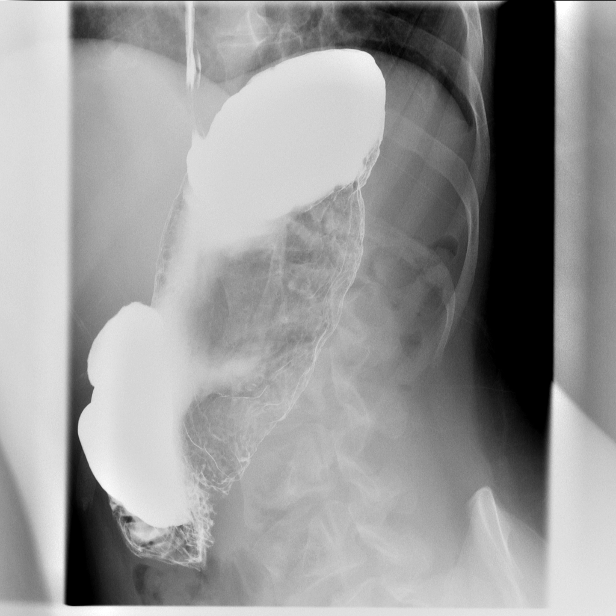

[Series 9: fluoro_barium 2fps_bw · 0.21mm/px · 1 of 1 slices shown (7 of 9)]
[im 1/1]
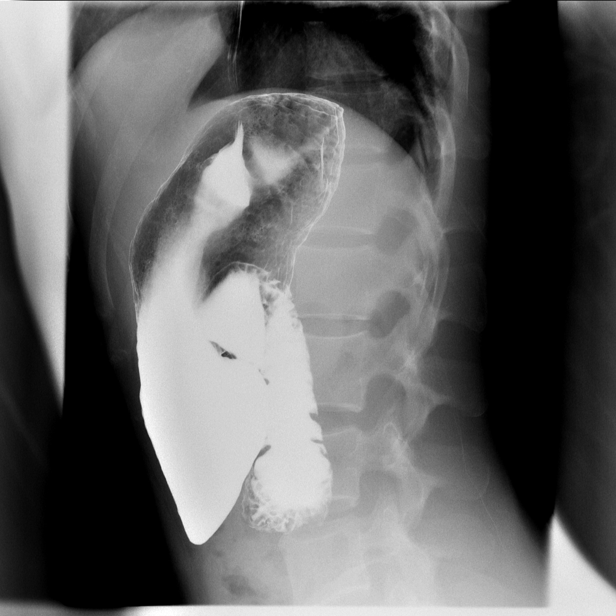

[Series 10: fluoro_barium 2fps_bw · 0.21mm/px · 1 of 1 slices shown (8 of 9)]
[im 1/1]
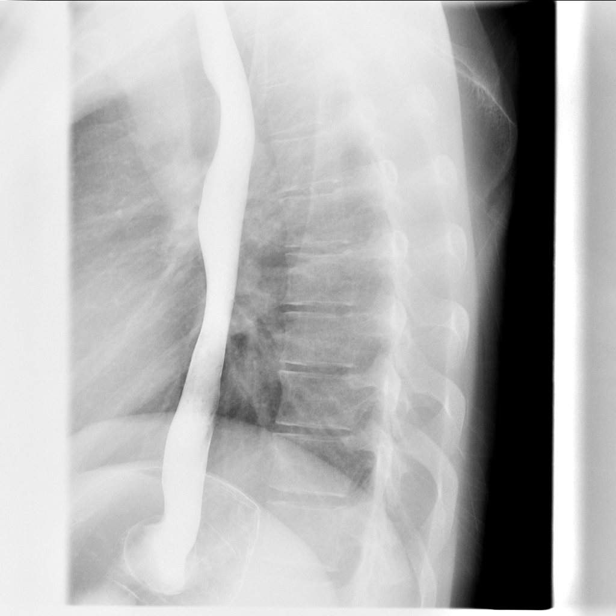

[Series 11: fluoro_barium 2fps_bw · 0.21mm/px · 1 of 1 slices shown (9 of 9)]
[im 1/1]
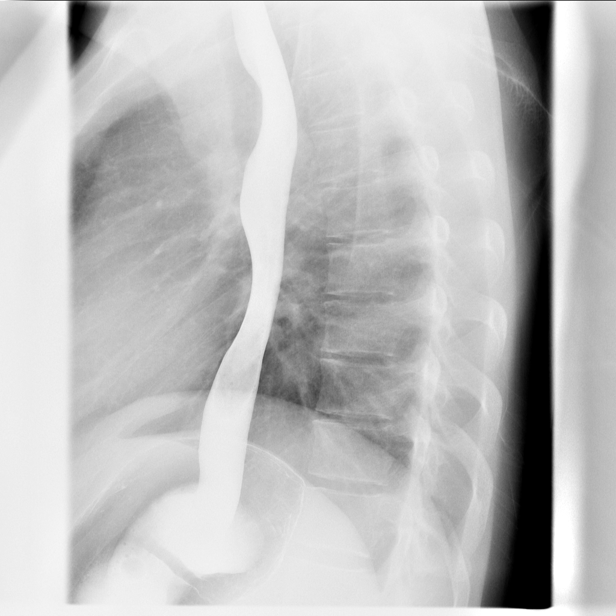

[15 of 17 positions shown; findings below may reference images not displayed]

FINDINGS: Esophageal and gastric mucosal pattern and contour normal. Normal
peristalsis. No mass lesion. No reflux. Duodenum is normal. No
ulceration. C-loop is normal.

Small bowel appears normal. Normal small bowel fold thickness in
caliber. Terminal ileum is normal.
IMPRESSION: Normal exam.

## 2017-05-06 ENCOUNTER — Encounter (HOSPITAL_COMMUNITY): Payer: Self-pay | Admitting: *Deleted

## 2017-05-06 ENCOUNTER — Emergency Department (HOSPITAL_COMMUNITY)
Admission: EM | Admit: 2017-05-06 | Discharge: 2017-05-06 | Disposition: A | Discharge status: Home / Self Care | Payer: BLUE CROSS/BLUE SHIELD | Attending: Emergency Medicine | Admitting: Emergency Medicine

## 2017-05-06 DIAGNOSIS — R1030 Lower abdominal pain, unspecified: Secondary | ICD-10-CM | POA: Diagnosis present

## 2017-05-06 LAB — URINALYSIS, ROUTINE W REFLEX MICROSCOPIC
BACTERIA UA: NONE SEEN
Bilirubin Urine: NEGATIVE
GLUCOSE, UA: NEGATIVE mg/dL
Ketones, ur: NEGATIVE mg/dL
LEUKOCYTES UA: NEGATIVE
NITRITE: NEGATIVE
PROTEIN: NEGATIVE mg/dL
SPECIFIC GRAVITY, URINE: 1.026 (ref 1.005–1.030)
pH: 6 (ref 5.0–8.0)

## 2017-05-06 LAB — COMPREHENSIVE METABOLIC PANEL
ALBUMIN: 3.9 g/dL (ref 3.5–5.0)
ALK PHOS: 56 U/L (ref 38–126)
ALT: 12 U/L — AB (ref 14–54)
AST: 15 U/L (ref 15–41)
Anion gap: 7 (ref 5–15)
BILIRUBIN TOTAL: 0.5 mg/dL (ref 0.3–1.2)
BUN: 8 mg/dL (ref 6–20)
CALCIUM: 8.9 mg/dL (ref 8.9–10.3)
CO2: 22 mmol/L (ref 22–32)
CREATININE: 0.96 mg/dL (ref 0.44–1.00)
Chloride: 109 mmol/L (ref 101–111)
GFR calc Af Amer: >60 mL/min (ref >60)
Glucose, Bld: 116 mg/dL — ABNORMAL HIGH (ref 65–99)
Potassium: 3.7 mmol/L (ref 3.5–5.1)
Sodium: 138 mmol/L (ref 135–145)
TOTAL PROTEIN: 6.9 g/dL (ref 6.5–8.1)

## 2017-05-06 LAB — WET PREP, GENITAL
Clue Cells Wet Prep HPF POC: NONE SEEN
SPERM: NONE SEEN
Trich, Wet Prep: NONE SEEN
Yeast Wet Prep HPF POC: NONE SEEN

## 2017-05-06 LAB — LIPASE, BLOOD: Lipase: 37 U/L (ref 11–51)

## 2017-05-06 LAB — CBC
HEMATOCRIT: 45 % (ref 36.0–46.0)
Hemoglobin: 15.6 g/dL — ABNORMAL HIGH (ref 12.0–15.0)
MCH: 30.6 pg (ref 26.0–34.0)
MCHC: 34.7 g/dL (ref 30.0–36.0)
MCV: 88.2 fL (ref 78.0–100.0)
PLATELETS: 351 10*3/uL (ref 150–400)
RBC: 5.1 MIL/uL (ref 3.87–5.11)
RDW: 12.1 % (ref 11.5–15.5)
WBC: 9.3 10*3/uL (ref 4.0–10.5)

## 2017-05-06 LAB — POC URINE PREG, ED: Preg Test, Ur: NEGATIVE

## 2017-05-06 MED ORDER — KETOROLAC TROMETHAMINE 30 MG/ML IJ SOLN
15.0000 mg | Freq: Once | INTRAMUSCULAR | Status: AC
  Administered 2017-05-06: 15 mg via INTRAMUSCULAR
  Filled 2017-05-06: qty 1

## 2017-05-06 MED ORDER — ONDANSETRON 4 MG PO TBDP: 4.0000 mg | ORAL_TABLET | Freq: Three times a day (TID) | ORAL | 0 refills | Status: DC | PRN

## 2017-05-06 NOTE — ED Triage Notes (Signed)
The pt has had lower abd pain since Thursday with vomiting.  lmp depo injection

## 2017-05-06 NOTE — ED Provider Notes (Signed)
[facility]-EMERGENCY DEPT Provider Note   CSN: [id number] Arrival date & time: 05/06/17  0247     History   Chief Complaint Chief Complaint  Patient presents with  . Abdominal Pain    HPI Monica [name] is a 36 y.o. female w PMHx recurrent UTIs, presenting to the ED for acute onset of intermittent lower abdominal pain that began Thursday. Abdominal pain is described as a "muscle pulling" across lower abdomen. Pain is sometimes made worse with movement. Pt states she has pulled her abdominal muscles in the past, and this feels similar, however she normally doesn't have assoc low back pain when this occurs. Pt reports b/l lower back pain that is 6/10 in severity, described as a strong ache, worst with movement. Reports 3 episodes of emesis on Saturday and twice yesterday. Last BM was last night around 2200. Denies current nausea, D/C, CP, F/C, vaginal bleeding or discharge, urinary symptoms. She reports LMP was over 2 years ago 2/t to depo shots. Last depo received in June and she states she won't be getting another when she is due for one in 2 weeks.  Reports hx of recurrent UTIs, with chronic blood found in urinalysis tests. States this does not feel like uti.  The history is provided by the patient.    Past Medical History:  Diagnosis Date  . Hx of varicella   . Urinary tract disease     Patient Active Problem List   Diagnosis Date Noted  . Pregnancy 06/08/2015  . SVD (spontaneous vaginal delivery) 06/08/2015    Past Surgical History:  Procedure Laterality Date  . WISDOM TOOTH EXTRACTION      OB History    Gravida Para Term Preterm AB Living   SAB TAB Ectopic Multiple Live Births         0 1       Home Medications    Prior to Admission medications   Medication Sig Start Date End Date Taking? Authorizing Provider  Multiple Vitamin (MULTIVITAMIN WITH MINERALS) TABS tablet Take 1 tablet by mouth daily.   Yes [provider]  ondansetron  (ZOFRAN ODT) 4 MG disintegrating tablet Take 1 tablet (4 mg total) by mouth every 8 (eight) hours as needed for nausea or vomiting. 05/06/17   Russo, [place] N, PA-C    Family History Family History  Problem Relation Age of Onset  . Cancer Mother   . Hyperlipidemia Father   . Hypertension Father   . Varicose Veins Paternal Grandmother     Social History Social History  Substance Use Topics  . Smoking status: Never Smoker  . Smokeless tobacco: Never Used  . Alcohol use Yes     Allergies   Patient has no known allergies.   Review of Systems Review of Systems  Constitutional: Negative for chills and fever.  Respiratory: Negative for shortness of breath.   Cardiovascular: Negative for chest pain.  Gastrointestinal: Positive for abdominal pain and vomiting. Negative for blood in stool, constipation and diarrhea.  Genitourinary: Negative for dysuria, frequency, vaginal bleeding and vaginal discharge.  Musculoskeletal: Positive for back pain.  All other systems reviewed and are negative.    Physical Exam Updated Vital Signs BP 107/85   Pulse 89   Temp 98.6 F (37 C) (Oral)   Resp 16   Ht  (1.626 m)   Wt 60.8 kg (134 lb)   SpO2 100%   BMI 23.00 kg/m   Physical  Exam  Constitutional: She appears well-developed and well-nourished. No distress.  Well-appearing  HENT:  Head: Normocephalic and atraumatic.  Eyes: Conjunctivae are normal.  Cardiovascular: Normal rate, regular rhythm, normal heart sounds and intact distal pulses.  Exam reveals no friction rub.   No murmur heard. Pulmonary/Chest: Effort normal and breath sounds normal. No respiratory distress. She has no wheezes. She has no rales.  Abdominal: Soft. Bowel sounds are normal. She exhibits no distension and no mass. There is no tenderness. There is no rebound and no guarding. No hernia.  No CVA tenderness  Genitourinary: Vagina normal and uterus normal. There is no rash or tenderness on the right labia.  There is no rash or tenderness on the left labia. Cervix exhibits discharge. Cervix exhibits no motion tenderness and no friability. Right adnexum displays no mass and no tenderness. Left adnexum displays no mass and no tenderness. No erythema or tenderness in the vagina.  Genitourinary Comments: Exam performed with chaperone present. Small amount of white discharge at cervical os.  Neurological: She is alert.  Skin: Skin is warm.  Psychiatric: She has a normal mood and affect. Her behavior is normal.  Nursing note and vitals reviewed.    ED Treatments / Results  Labs (all labs ordered are listed, but only abnormal results are displayed) Labs Reviewed  WET PREP, GENITAL - Abnormal; Notable for the following:       Result Value   WBC, Wet Prep HPF POC MANY (*)    All other components within normal limits  COMPREHENSIVE METABOLIC PANEL - Abnormal; Notable for the following:    Glucose, Bld 116 (*)    ALT 12 (*)    All other components within normal limits  CBC - Abnormal; Notable for the following:    Hemoglobin 15.6 (*)    All other components within normal limits  URINALYSIS, ROUTINE W REFLEX MICROSCOPIC - Abnormal; Notable for the following:    APPearance CLOUDY (*)    Hgb urine dipstick MODERATE (*)    Squamous Epithelial / LPF 0-5 (*)    All other components within normal limits  LIPASE, BLOOD  POC URINE PREG, ED  GC/CHLAMYDIA PROBE AMP ([facility]) NOT AT [facility]    EKG  EKG Interpretation None       Radiology No results found.  Procedures Procedures (including critical care time)  Medications Ordered in ED Medications  ketorolac (TORADOL) 30 MG/ML injection 15 mg (15 mg Intramuscular Given 05/06/17 0724)     Initial Impression / Assessment and Plan / ED Course  I have reviewed the triage vital signs and the nursing notes.  Pertinent labs & imaging results that were available during my care of the patient were reviewed by me and considered in my medical  decision making (see chart for details).    Pt w lower abdominal pain. Patient is nontoxic, nonseptic appearing, in no apparent distress.  Patient's pain and other symptoms adequately managed in emergency department.  Abdomen without tenderness or guarding on exam. Pelvic exam unremarkable. Labs and vitals reviewed.  Patient does not meet the SIRS or Sepsis criteria.  On repeat exam patient does not have a surgical abdomen and there are no peritoneal signs.  No indication of appendicitis, bowel obstruction, bowel perforation, cholecystitis, diverticulitis, PID or ectopic pregnancy.  Patient discharged home with symptomatic treatment and given strict instructions for follow-up with their primary care physician. Strict return precautions discussed. Pt safe for discharge.  Discussed results, findings, treatment and follow up. Patient advised  of return precautions. Patient verbalized understanding and agreed with plan.  Final Clinical Impressions(s) / ED Diagnoses   Final diagnoses:  Lower abdominal pain    New Prescriptions New Prescriptions   ONDANSETRON (ZOFRAN ODT) 4 MG DISINTEGRATING TABLET    Take 1 tablet (4 mg total) by mouth every 8 (eight) hours as needed for nausea or vomiting.     Russo, [place] N, PA-C 05/06/17 1610    Derwood Kaplan, MD 05/09/17 1600

## 2017-05-06 NOTE — Discharge Instructions (Signed)
Please read instructions below. You can take aleve as needed for back ache. You can take zofran every 8 hours as needed for nausea. Schedule an appointment with your primary care provider for follow up. Return to the ER if abdominal pain worsens, if you develop fever, if you are unable to keep down liquids, or for new or concerning symptoms.

## 2017-05-07 LAB — GC/CHLAMYDIA PROBE AMP (~~LOC~~) NOT AT ARMC
CHLAMYDIA, DNA PROBE: NEGATIVE
Neisseria Gonorrhea: NEGATIVE

## 2018-02-26 ENCOUNTER — Ambulatory Visit (HOSPITAL_COMMUNITY)
Admission: EM | Admit: 2018-02-26 | Discharge: 2018-02-26 | Disposition: A | Discharge status: Home / Self Care | Payer: BLUE CROSS/BLUE SHIELD | Attending: Internal Medicine | Admitting: Internal Medicine

## 2018-02-26 ENCOUNTER — Encounter (HOSPITAL_COMMUNITY): Payer: Self-pay | Admitting: Emergency Medicine

## 2018-02-26 DIAGNOSIS — K2100 Gastro-esophageal reflux disease with esophagitis, without bleeding: Secondary | ICD-10-CM

## 2018-02-26 MED ORDER — GI COCKTAIL ~~LOC~~
ORAL | Status: AC
  Filled 2018-02-26: qty 30

## 2018-02-26 MED ORDER — GI COCKTAIL ~~LOC~~
30.0000 mL | Freq: Once | ORAL | Status: AC
  Administered 2018-02-26: 30 mL via ORAL

## 2018-02-26 MED ORDER — RANITIDINE HCL 150 MG PO TABS: 150.0000 mg | ORAL_TABLET | Freq: Two times a day (BID) | ORAL | 2 refills | Status: DC

## 2018-02-26 MED ORDER — PANTOPRAZOLE SODIUM 20 MG PO TBEC: 20.0000 mg | DELAYED_RELEASE_TABLET | Freq: Every day | ORAL | 0 refills | Status: DC

## 2018-02-26 NOTE — ED Triage Notes (Signed)
Pt sts increased heartburn sx x 3 days

## 2018-05-26 ENCOUNTER — Emergency Department (HOSPITAL_COMMUNITY)
Admission: EM | Admit: 2018-05-26 | Discharge: 2018-05-27 | Disposition: A | Discharge status: Home / Self Care | Payer: BLUE CROSS/BLUE SHIELD | Attending: Emergency Medicine | Admitting: Emergency Medicine

## 2018-05-26 DIAGNOSIS — R1084 Generalized abdominal pain: Secondary | ICD-10-CM | POA: Diagnosis present

## 2018-05-26 DIAGNOSIS — N390 Urinary tract infection, site not specified: Secondary | ICD-10-CM

## 2018-05-27 ENCOUNTER — Other Ambulatory Visit: Payer: Self-pay

## 2018-05-27 ENCOUNTER — Encounter (HOSPITAL_COMMUNITY): Payer: Self-pay

## 2018-05-27 LAB — URINALYSIS, ROUTINE W REFLEX MICROSCOPIC
Bilirubin Urine: NEGATIVE
GLUCOSE, UA: NEGATIVE mg/dL
KETONES UR: NEGATIVE mg/dL
NITRITE: NEGATIVE
PH: 7 (ref 5.0–8.0)
Protein, ur: NEGATIVE mg/dL
Specific Gravity, Urine: 1.001 — ABNORMAL LOW (ref 1.005–1.030)

## 2018-05-27 LAB — PREGNANCY, URINE: Preg Test, Ur: NEGATIVE

## 2018-05-27 MED ORDER — CEPHALEXIN 500 MG PO CAPS: 500.0000 mg | ORAL_CAPSULE | Freq: Three times a day (TID) | ORAL | 0 refills | Status: AC

## 2018-05-27 MED ORDER — CEPHALEXIN 250 MG PO CAPS
500.0000 mg | ORAL_CAPSULE | Freq: Once | ORAL | Status: AC
  Administered 2018-05-27: 500 mg via ORAL
  Filled 2018-05-27: qty 2

## 2018-05-27 NOTE — ED Provider Notes (Signed)
[facility] EMERGENCY DEPARTMENT Provider Note  CSN: [id number] Arrival date & time: 05/26/18 2354  Chief Complaint(s) Urinary Tract Infection  HPI Monica [name] is a 37 y.o. female   The history is provided by the patient.  Urinary Tract Infection   This is a recurrent (since puberty; last time was 3 yrs ago) problem. The current episode started yesterday. The problem occurs every urination. The problem has not changed since onset.The quality of the pain is described as burning. The pain is moderate. There has been no fever. Associated symptoms include frequency, hematuria, urgency and flank pain. Pertinent negatives include no chills, no nausea, no vomiting, no discharge and no possible pregnancy. Her past medical history is significant for recurrent UTIs. Her past medical history does not include kidney stones.    Past Medical History Past Medical History:  Diagnosis Date  . Hx of varicella   . Urinary tract disease    Patient Active Problem List   Diagnosis Date Noted  . Pregnancy 06/08/2015  . SVD (spontaneous vaginal delivery) 06/08/2015   Home Medication(s) Prior to Admission medications   Medication Sig Start Date End Date Taking? Authorizing Provider  cephALEXin (KEFLEX) 500 MG capsule Take 1 capsule (500 mg total) by mouth 3 (three) times daily for 5 days. 05/27/18 06/01/18  Nira Conn, MD  Multiple Vitamin (MULTIVITAMIN WITH MINERALS) TABS tablet Take 1 tablet by mouth daily.    [provider]  ondansetron (ZOFRAN ODT) 4 MG disintegrating tablet Take 1 tablet (4 mg total) by mouth every 8 (eight) hours as needed for nausea or vomiting. 05/06/17   Robinson, [place] N, PA-C  pantoprazole (PROTONIX) 20 MG tablet Take 1 tablet (20 mg total) by mouth daily for 14 days. 02/26/18 03/12/18  Arnaldo Natal, MD  ranitidine (ZANTAC) 150 MG tablet Take 1 tablet (150 mg total) by mouth 2 (two) times daily. 02/26/18   Arnaldo Natal, MD                                                                                                                                      Past Surgical History Past Surgical History:  Procedure Laterality Date  . WISDOM TOOTH EXTRACTION     Family History Family History  Problem Relation Age of Onset  . Cancer Mother   . Hyperlipidemia Father   . Hypertension Father   . Varicose Veins Paternal Grandmother     Social History Social History   Tobacco Use  . Smoking status: Never Smoker  . Smokeless tobacco: Never Used  Substance Use Topics  . Alcohol use: Yes  . Drug use: Not on file   Allergies Patient has no known allergies.  Review of Systems Review of Systems  Constitutional: Negative for chills.  Gastrointestinal: Negative for nausea and vomiting.  Genitourinary: Positive for flank pain, frequency, hematuria and urgency.   All other systems are reviewed  and are negative for acute change except as noted in the HPI  Physical Exam Vital Signs  I have reviewed the triage vital signs BP (!) 127/100 (BP Location: Right Arm)   Pulse 75   Temp 98.2 F (36.8 C) (Oral)   Resp 17   Ht 5\' 4"  (1.626 m)   Wt 58.1 kg   SpO2 100%   BMI 21.97 kg/m   Physical Exam  Constitutional: She is oriented to person, place, and time. She appears well-developed and well-nourished. No distress.  HENT:  Head: Normocephalic and atraumatic.  Right Ear: External ear normal.  Left Ear: External ear normal.  Nose: Nose normal.  Eyes: Conjunctivae and EOM are normal. No scleral icterus.  Neck: Normal range of motion and phonation normal.  Cardiovascular: Normal rate and regular rhythm.  Pulmonary/Chest: Effort normal. No stridor. No respiratory distress.  Abdominal: She exhibits no distension. There is no tenderness. There is no CVA tenderness.  Musculoskeletal: Normal range of motion. She exhibits no edema.  Neurological: She is alert and oriented to person, place, and time.  Skin: She is  not diaphoretic.  Psychiatric: She has a normal mood and affect. Her behavior is normal.  Vitals reviewed.   ED Results and Treatments Labs (all labs ordered are listed, but only abnormal results are displayed) Labs Reviewed  URINALYSIS, ROUTINE W REFLEX MICROSCOPIC - Abnormal; Notable for the following components:      Result Value   Color, Urine STRAW (*)    Specific Gravity, Urine 1.001 (*)    Hgb urine dipstick LARGE (*)    Leukocytes, UA MODERATE (*)    Bacteria, UA RARE (*)    All other components within normal limits  PREGNANCY, URINE                                                                                                                         EKG  EKG Interpretation  Date/Time:    Ventricular Rate:    PR Interval:    QRS Duration:   QT Interval:    QTC Calculation:   R Axis:     Text Interpretation:        Radiology No results found. Pertinent labs & imaging results that were available during my care of the patient were reviewed by me and considered in my medical decision making (see chart for details).  Medications Ordered in ED Medications  cephALEXin (KEFLEX) capsule 500 mg (has no administration in time range)  Procedures Procedures  (including critical care time)  Medical Decision Making / ED Course I have reviewed the nursing notes for this encounter and the patient's prior records (if available in EHR or on provided paperwork).    Presents with symptoms consistent with her prior urinary tract infections.  She is afebrile with stable vital signs.  No evidence of toxicity.  Abdomen benign.  UA suspicious for urinary tract infection.  Appropriate for outpatient management.  Final Clinical Impression(s) / ED Diagnoses Final diagnoses:  Lower urinary tract infectious disease   Disposition: Discharge  Condition:  Good  I have discussed the results, Dx and Tx plan with the patient who expressed understanding and agree(s) with the plan. Discharge instructions discussed at great length. The patient was given strict return precautions who verbalized understanding of the instructions. No further questions at time of discharge.    ED Discharge Orders         Ordered    cephALEXin (KEFLEX) 500 MG capsule  3 times daily     05/27/18 0213           Follow Up: Titus Mould, NP [address] [place] [address] [phone]  Schedule an appointment as soon as possible for a visit  in 3-5 days, If symptoms do not improve or  worsen      This chart was dictated using voice recognition software.  Despite best efforts to proofread,  errors can occur which can change the documentation meaning.   Nira Conn, MD 05/27/18 [phone]

## 2018-05-27 NOTE — ED Notes (Signed)
Urine sent to lab prior to POC Urine being run.  Lab pregnancy sent down.

## 2018-05-27 NOTE — ED Triage Notes (Signed)
Pt here complaints of urine turning a light pink. Having burning with urination. Hx of recurrent UTI.  A&Ox4.

## 2018-07-15 ENCOUNTER — Emergency Department (HOSPITAL_COMMUNITY)
Admission: EM | Admit: 2018-07-15 | Discharge: 2018-07-15 | Disposition: A | Discharge status: Home / Self Care | Payer: BLUE CROSS/BLUE SHIELD | Attending: Emergency Medicine | Admitting: Emergency Medicine

## 2018-07-15 ENCOUNTER — Encounter (HOSPITAL_COMMUNITY): Payer: Self-pay | Admitting: Emergency Medicine

## 2018-07-15 ENCOUNTER — Other Ambulatory Visit: Payer: Self-pay

## 2018-07-15 DIAGNOSIS — R8281 Pyuria: Secondary | ICD-10-CM | POA: Insufficient documentation

## 2018-07-15 DIAGNOSIS — O2341 Unspecified infection of urinary tract in pregnancy, first trimester: Secondary | ICD-10-CM | POA: Diagnosis not present

## 2018-07-15 DIAGNOSIS — O219 Vomiting of pregnancy, unspecified: Secondary | ICD-10-CM | POA: Diagnosis present

## 2018-07-15 DIAGNOSIS — Z3A01 Less than 8 weeks gestation of pregnancy: Secondary | ICD-10-CM | POA: Insufficient documentation

## 2018-07-15 DIAGNOSIS — Z79899 Other long term (current) drug therapy: Secondary | ICD-10-CM | POA: Insufficient documentation

## 2018-07-15 LAB — COMPREHENSIVE METABOLIC PANEL
ALK PHOS: 37 U/L — AB (ref 38–126)
ALT: 11 U/L (ref 0–44)
AST: 14 U/L — AB (ref 15–41)
Albumin: 3.8 g/dL (ref 3.5–5.0)
Anion gap: 6 (ref 5–15)
BILIRUBIN TOTAL: 0.7 mg/dL (ref 0.3–1.2)
BUN: <5 mg/dL — ABNORMAL LOW (ref 6–20)
CALCIUM: 8.8 mg/dL — AB (ref 8.9–10.3)
CO2: 22 mmol/L (ref 22–32)
CREATININE: 0.78 mg/dL (ref 0.44–1.00)
Chloride: 107 mmol/L (ref 98–111)
Glucose, Bld: 95 mg/dL (ref 70–99)
Potassium: 4 mmol/L (ref 3.5–5.1)
Sodium: 135 mmol/L (ref 135–145)
TOTAL PROTEIN: 6.4 g/dL — AB (ref 6.5–8.1)

## 2018-07-15 LAB — URINALYSIS, ROUTINE W REFLEX MICROSCOPIC
BILIRUBIN URINE: NEGATIVE
GLUCOSE, UA: NEGATIVE mg/dL
KETONES UR: 20 mg/dL — AB
Nitrite: NEGATIVE
PROTEIN: NEGATIVE mg/dL
Specific Gravity, Urine: 1.006 (ref 1.005–1.030)
pH: 7 (ref 5.0–8.0)

## 2018-07-15 LAB — I-STAT BETA HCG BLOOD, ED (MC, WL, AP ONLY): I-stat hCG, quantitative: >2000 m[IU]/mL — ABNORMAL HIGH (ref <5)

## 2018-07-15 LAB — MAGNESIUM: MAGNESIUM: 2.1 mg/dL (ref 1.7–2.4)

## 2018-07-15 MED ORDER — SODIUM CHLORIDE 0.9 % IV BOLUS
500.0000 mL | Freq: Once | INTRAVENOUS | Status: AC
  Administered 2018-07-15: 500 mL via INTRAVENOUS

## 2018-07-15 MED ORDER — METOCLOPRAMIDE HCL 5 MG/ML IJ SOLN
10.0000 mg | Freq: Once | INTRAMUSCULAR | Status: AC
  Administered 2018-07-15: 10 mg via INTRAVENOUS
  Filled 2018-07-15: qty 2

## 2018-07-15 MED ORDER — CEPHALEXIN 500 MG PO CAPS: 500.0000 mg | ORAL_CAPSULE | Freq: Four times a day (QID) | ORAL | 0 refills | Status: AC

## 2018-07-15 NOTE — ED Triage Notes (Signed)
Pt presents to the ED from home. Pt is currently [redacted] weeks pregnant and has stated she has been unable to hold anything down for 48 hours.

## 2018-07-15 NOTE — ED Notes (Signed)
Pt ambulated to restroom to give urine sample.

## 2018-07-15 NOTE — Discharge Instructions (Addendum)
Thanks for allowing [facility] taking care [facility]. Your blood test are normal. We recommend to take Unisom and vit B12 for your nausea and vomiting of pregnancy. I also give you antibiotic for probable mild urinary infection. Please take the medicine and follow up with your primary care. If your symptoms did not improve, please come back to the hospital.  Thanks.

## 2018-07-15 NOTE — ED Notes (Signed)
Patient verbalizes understanding of discharge instructions. Opportunity for questioning and answers were provided. Armband removed by staff, pt discharged from ED ambulatory to home.  

## 2018-07-15 NOTE — ED Provider Notes (Addendum)
[facility] EMERGENCY DEPARTMENT Provider Note   CSN: [id number] Arrival date & time: 07/15/18  1750     History   Chief Complaint No chief complaint on file.   HPI Monica [name] is a 37 y.o. female. At 6th weeks of pregnancy. Presented to ED due to nausea and vomiting for past 2 days.  She was not able to keep food or fluid down.  She says that she vomited 5-6 times today.  She feels tired with mild body ache. Denies dizziness, lightheadedness or headache. Does not report any vaginal bleeding. No abdominal pain, except muscle pain after vomiting.  No diarrhea.  No fever, No cough. She dose not take any medicine except prenatal vitamins.   She is gravida 2 para 1.  Did not have any hyperemesis gravidarum or other complication on her previous pregnancy.  HPI  Past Medical History:  Diagnosis Date  . Hx of varicella   . Urinary tract disease     Patient Active Problem List   Diagnosis Date Noted  . Pregnancy 06/08/2015  . SVD (spontaneous vaginal delivery) 06/08/2015    Past Surgical History:  Procedure Laterality Date  . WISDOM TOOTH EXTRACTION       OB History    Gravida  1   Para  1   Term  1   Preterm      AB      Living  1     SAB      TAB      Ectopic      Multiple  0   Live Births  1            Home Medications    Prior to Admission medications   Medication Sig Start Date End Date Taking? Authorizing Provider  Multiple Vitamin (MULTIVITAMIN WITH MINERALS) TABS tablet Take 1 tablet by mouth daily.    [provider]  ondansetron (ZOFRAN ODT) 4 MG disintegrating tablet Take 1 tablet (4 mg total) by mouth every 8 (eight) hours as needed for nausea or vomiting. 05/06/17   Robinson, [place] N, PA-C  pantoprazole (PROTONIX) 20 MG tablet Take 1 tablet (20 mg total) by mouth daily for 14 days. 02/26/18 03/12/18  Arnaldo Natal, MD  ranitidine (ZANTAC) 150 MG tablet Take 1 tablet (150 mg total) by mouth 2 (two)  times daily. 02/26/18   Arnaldo Natal, MD    Family History Family History  Problem Relation Age of Onset  . Cancer Mother   . Hyperlipidemia Father   . Hypertension Father   . Varicose Veins Paternal Grandmother     Social History Social History   Tobacco Use  . Smoking status: Never Smoker  . Smokeless tobacco: Never Used  Substance Use Topics  . Alcohol use: Yes  . Drug use: Not on file     Allergies   Patient has no known allergies.   Review of Systems Review of Systems   Physical Exam Updated Vital Signs There were no vitals taken for this visit.  Marland KitchenPhysical Exam  Constitutional: She is oriented to person, place, and time. She appears well-developed and well-nourished. No distress.  HENT:  Head: Normocephalic and atraumatic.  Eyes: EOM are normal.  Cardiovascular: Normal rate, regular rhythm, normal heart sounds and intact distal pulses.  No murmur heard. Pulmonary/Chest: Effort normal and breath sounds normal. She has no wheezes. She has no rales.  Abdominal: Soft. Bowel sounds are normal. There is no tenderness.  Musculoskeletal: She  exhibits no edema.  Neurological: She is alert and oriented to person, place, and time.  Skin: Skin is warm and dry.  Psychiatric: She has a normal mood and affect. Her behavior is normal.    ED Treatments / Results  Labs (all labs ordered are listed, but only abnormal results are displayed) Labs Reviewed - No data to display  EKG None  Radiology No results found.  Procedures Procedures (including critical care time)  Medications Ordered in ED Medications - No data to display   Initial Impression / Assessment and Plan / ED Course  I have reviewed the triage vital signs and the nursing notes.  Pertinent labs & imaging results that were available during my care of the patient were reviewed by me and considered in my medical decision making (see chart for details).   Nausea and vomiting of  pregnancy: Hemodynamically stable on exam, Will hydrate and check for electrolyte abnormality and will evaluate PO tolerance before discharge.  -NS 1 lit bolus -IV Metoclopramide 10 mg once -CMP, Mg -I-stat B-HCG -U/A  Update: Patient was seen and evaluated again. Feels much better. Has tolerated small PO intake. B HCG confirmed pregnancy. CMP and Mg normal. UA showed sterile pyuria.  Will prescribe Keflex.  Patient clinically improved, tolerated p.o. Diet and can be discharged   Final Clinical Impressions(s) / ED Diagnoses   Final diagnoses:  None    ED Discharge Orders    None       Chevis PrettyMasoudi, Youssef Footman, MD 07/15/18 2241    Chevis PrettyMasoudi, Shelbe Haglund, MD 07/15/18 2324    Gwyneth SproutPlunkett, Whitney, MD 07/18/18 1111

## 2018-07-17 LAB — URINE CULTURE

## 2018-08-27 NOTE — L&D Delivery Note (Signed)
Delivery Note Pt progressed to complete dilation and pushed well.  At 3:45 AM a healthy female was delivered via Vaginal, Spontaneous (Presentation: ROA  ).  APGAR: 9, 9; weight pending .   Placenta status: delivered spontaneously but had a battledore appearance with an area of central clearing.  Manual exploration of uterus done to ensure no fragments felt, and none appreciated.  Uterine cavity felt arcuate.  Cord:  with the following complications: none.   Anesthesia:  Epidural Episiotomy: None Lacerations: 1st degree Suture Repair: 3.0 vicryl rapide Est. Blood Loss (mL): 254mL  Mom to postpartum.  Baby to Couplet care / Skin to Skin. D/w pt circumcision and she desires to proceed at hospital, has not paid yet at office and will do so today.  Logan Bores 02/18/2019, 4:31 AM

## 2018-10-14 ENCOUNTER — Ambulatory Visit (HOSPITAL_COMMUNITY)
Admission: EM | Admit: 2018-10-14 | Discharge: 2018-10-14 | Disposition: A | Discharge status: Home / Self Care | Payer: BLUE CROSS/BLUE SHIELD | Attending: Family Medicine | Admitting: Family Medicine

## 2018-10-14 ENCOUNTER — Encounter (HOSPITAL_COMMUNITY): Payer: Self-pay

## 2018-10-14 ENCOUNTER — Other Ambulatory Visit: Payer: Self-pay

## 2018-10-14 DIAGNOSIS — N3090 Cystitis, unspecified without hematuria: Secondary | ICD-10-CM | POA: Diagnosis present

## 2018-10-14 LAB — POCT URINALYSIS DIP (DEVICE)
BILIRUBIN URINE: NEGATIVE
Glucose, UA: NEGATIVE mg/dL
Nitrite: NEGATIVE
Protein, ur: 30 mg/dL — AB
SPECIFIC GRAVITY, URINE: 1.025 (ref 1.005–1.030)
Urobilinogen, UA: 0.2 mg/dL (ref 0.0–1.0)
pH: 6 (ref 5.0–8.0)

## 2018-10-14 MED ORDER — CEPHALEXIN 500 MG PO CAPS: 500.0000 mg | ORAL_CAPSULE | Freq: Two times a day (BID) | ORAL | 0 refills | Status: DC

## 2018-10-14 NOTE — ED Provider Notes (Signed)
[facility]    CSN: [id number] Arrival date & time: 10/14/18  1750     History   Chief Complaint Chief Complaint  Patient presents with  . Urinary Tract Infection    HPI Monica [name] is a 38 y.o. female.   38 year old female who is [redacted] weeks pregnant comes in for urinary symptoms that started today.  States she has had dysuria today.  Has had more urgency.  States hard to judge urinary frequency due to pregnancy.  Denies hematuria.  Denies nausea, vomiting.  Denies fever, chills, night sweats.  Denies abdominal pain.  Denies vaginal discharge, vaginal bleeding.     Past Medical History:  Diagnosis Date  . Hx of varicella   . Urinary tract disease     Patient Active Problem List   Diagnosis Date Noted  . Pregnancy 06/08/2015  . SVD (spontaneous vaginal delivery) 06/08/2015    Past Surgical History:  Procedure Laterality Date  . WISDOM TOOTH EXTRACTION      OB History    Gravida  2   Para  1   Term  1   Preterm      AB      Living  1     SAB      TAB      Ectopic      Multiple  0   Live Births  1            Home Medications    Prior to Admission medications   Medication Sig Start Date End Date Taking? Authorizing Provider  cephALEXin (KEFLEX) 500 MG capsule Take 1 capsule (500 mg total) by mouth 2 (two) times daily. 10/14/18   Cathie Hoops, Amy V, PA-C  ondansetron (ZOFRAN ODT) 4 MG disintegrating tablet Take 1 tablet (4 mg total) by mouth every 8 (eight) hours as needed for nausea or vomiting. Patient not taking: Reported on 07/15/2018 05/06/17   Robinson, [place] N, PA-C  Prenatal Vit-Fe Fumarate-FA (PRENATAL PO) Take 1 tablet by mouth daily.    [provider]  ranitidine (ZANTAC) 150 MG tablet Take 1 tablet (150 mg total) by mouth 2 (two) times daily. Patient not taking: Reported on 07/15/2018 02/26/18   Arnaldo Natal, MD    Family History Family History  Problem Relation Age of Onset  . Cancer Mother   .  Hyperlipidemia Father   . Hypertension Father   . Varicose Veins Paternal Grandmother     Social History Social History   Tobacco Use  . Smoking status: Never Smoker  . Smokeless tobacco: Never Used  Substance Use Topics  . Alcohol use: Not Currently  . Drug use: Never     Allergies   Patient has no known allergies.   Review of Systems Review of Systems  Reason unable to perform ROS: See HPI as above.     Physical Exam Triage Vital Signs ED Triage Vitals  Enc Vitals Group     BP 10/14/18 1850 96/64     Pulse Rate 10/14/18 1850 78     Resp 10/14/18 1850 18     Temp 10/14/18 1850 98.2 F (36.8 C)     Temp Source 10/14/18 1850 Oral     SpO2 10/14/18 1850 100 %     Weight 10/14/18 1848 131 lb (59.4 kg)     Height --      Head Circumference --      Peak Flow --      Pain Score 10/14/18 1848  5     Pain Loc --      Pain Edu? --      Excl. in GC? --    No data found.  Updated Vital Signs BP 96/64 (BP Location: Right Arm)   Pulse 78   Temp 98.2 F (36.8 C) (Oral)   Resp 18   Wt 131 lb (59.4 kg)   LMP 06/02/2018   SpO2 100%   BMI 22.49 kg/m   Physical Exam Constitutional:      General: She is not in acute distress.    Appearance: She is well-developed.  HENT:     Head: Normocephalic and atraumatic.  Eyes:     Conjunctiva/sclera: Conjunctivae normal.     Pupils: Pupils are equal, round, and reactive to light.  Cardiovascular:     Rate and Rhythm: Normal rate and regular rhythm.     Heart sounds: Normal heart sounds. No murmur. No friction rub. No gallop.   Pulmonary:     Effort: Pulmonary effort is normal.     Breath sounds: Normal breath sounds. No wheezing or rales.  Abdominal:     General: Bowel sounds are normal.     Palpations: Abdomen is soft.     Tenderness: There is no abdominal tenderness. There is no guarding or rebound.  Skin:    General: Skin is warm and dry.  Neurological:     Mental Status: She is alert and oriented to person,  place, and time.  Psychiatric:        Behavior: Behavior normal.        Judgment: Judgment normal.      UC Treatments / Results  Labs (all labs ordered are listed, but only abnormal results are displayed) Labs Reviewed  POCT URINALYSIS DIP (DEVICE) - Abnormal; Notable for the following components:      Result Value   Ketones, ur TRACE (*)    Hgb urine dipstick LARGE (*)    Protein, ur 30 (*)    Leukocytes,Ua TRACE (*)    All other components within normal limits  URINE CULTURE    EKG None  Radiology No results found.  Procedures Procedures (including critical care time)  Medications Ordered in UC Medications - No data to display  Initial Impression / Assessment and Plan / UC Course  I have reviewed the triage vital signs and the nursing notes.  Pertinent labs & imaging results that were available during my care of the patient were reviewed by me and considered in my medical decision making (see chart for details).    Urine dipstick positive for UTI. Start antibiotics as directed. Push fluids. Return precautions given.  Final Clinical Impressions(s) / UC Diagnoses   Final diagnoses:  Cystitis    ED Prescriptions    Medication Sig Dispense Auth. Provider   cephALEXin (KEFLEX) 500 MG capsule Take 1 capsule (500 mg total) by mouth 2 (two) times daily. 14 capsule Threasa Alpha, [place] 10/14/18 1935

## 2018-10-14 NOTE — Discharge Instructions (Signed)
Your urine was positive for an urinary tract infection. Start keflex as directed. Keep hydrated, urine should be clear to pale yellow in color. Monitor for any worsening of symptoms, fever, abdominal pain, vaginal bleeding/blood in urine, please go to the emergency department for further evaluation needed.

## 2018-10-14 NOTE — ED Triage Notes (Signed)
Pt cc thinks she has a UTI. Pt states it hurts when she void and she has been voiding a lot. This started today.

## 2018-10-16 ENCOUNTER — Other Ambulatory Visit (HOSPITAL_COMMUNITY): Payer: Self-pay | Admitting: Obstetrics and Gynecology

## 2018-10-16 DIAGNOSIS — O283 Abnormal ultrasonic finding on antenatal screening of mother: Secondary | ICD-10-CM

## 2018-10-16 DIAGNOSIS — Z3A21 21 weeks gestation of pregnancy: Secondary | ICD-10-CM

## 2018-10-17 ENCOUNTER — Telehealth (HOSPITAL_COMMUNITY): Payer: Self-pay | Admitting: Emergency Medicine

## 2018-10-17 LAB — URINE CULTURE: Culture: 80000 — AB

## 2018-10-17 NOTE — Telephone Encounter (Signed)
Patient contacted and made aware of all results, all questions answered. States she is feeling much better and will follow up with OBGYN.

## 2018-10-27 ENCOUNTER — Encounter (HOSPITAL_COMMUNITY): Payer: Self-pay | Admitting: *Deleted

## 2018-10-28 ENCOUNTER — Ambulatory Visit (HOSPITAL_COMMUNITY): Payer: BLUE CROSS/BLUE SHIELD | Admitting: *Deleted

## 2018-10-28 ENCOUNTER — Ambulatory Visit (HOSPITAL_COMMUNITY)
Admission: RE | Admit: 2018-10-28 | Discharge: 2018-10-28 | Disposition: A | Discharge status: Home / Self Care | Payer: BLUE CROSS/BLUE SHIELD | Source: Ambulatory Visit | Attending: Obstetrics and Gynecology | Admitting: Obstetrics and Gynecology

## 2018-10-28 ENCOUNTER — Encounter (HOSPITAL_COMMUNITY): Payer: Self-pay

## 2018-10-28 VITALS — BP 101/67 | HR 71 | Wt 132.6 lb

## 2018-10-28 DIAGNOSIS — Z3A21 21 weeks gestation of pregnancy: Secondary | ICD-10-CM | POA: Diagnosis not present

## 2018-10-28 DIAGNOSIS — Z363 Encounter for antenatal screening for malformations: Secondary | ICD-10-CM | POA: Diagnosis not present

## 2018-10-28 DIAGNOSIS — O09522 Supervision of elderly multigravida, second trimester: Secondary | ICD-10-CM

## 2018-10-28 DIAGNOSIS — O359XX0 Maternal care for (suspected) fetal abnormality and damage, unspecified, not applicable or unspecified: Secondary | ICD-10-CM

## 2018-10-28 DIAGNOSIS — O283 Abnormal ultrasonic finding on antenatal screening of mother: Secondary | ICD-10-CM | POA: Diagnosis present

## 2019-02-15 ENCOUNTER — Inpatient Hospital Stay (HOSPITAL_COMMUNITY)
Admission: AD | Admit: 2019-02-15 | Discharge: 2019-02-15 | Disposition: A | Discharge status: Home / Self Care | Payer: BC Managed Care – PPO | Attending: Obstetrics and Gynecology | Admitting: Obstetrics and Gynecology

## 2019-02-15 ENCOUNTER — Other Ambulatory Visit: Payer: Self-pay

## 2019-02-15 DIAGNOSIS — Z3A36 36 weeks gestation of pregnancy: Secondary | ICD-10-CM | POA: Insufficient documentation

## 2019-02-15 DIAGNOSIS — N898 Other specified noninflammatory disorders of vagina: Secondary | ICD-10-CM | POA: Diagnosis present

## 2019-02-15 DIAGNOSIS — O09523 Supervision of elderly multigravida, third trimester: Secondary | ICD-10-CM | POA: Insufficient documentation

## 2019-02-15 DIAGNOSIS — O479 False labor, unspecified: Secondary | ICD-10-CM | POA: Diagnosis not present

## 2019-02-15 DIAGNOSIS — Z0371 Encounter for suspected problem with amniotic cavity and membrane ruled out: Secondary | ICD-10-CM | POA: Diagnosis not present

## 2019-02-15 LAB — POCT FERN TEST: POCT Fern Test: NEGATIVE

## 2019-02-15 NOTE — MAU Note (Signed)
Monica [name] is a 38 y.o. at [redacted]w[redacted]d here in MAU reporting: reporting pressure, back pain, and vaginal discharge. Started yesterday and it has gotten worse today. Back pain and pressure are both intermittent and come at the same time when the pain occurs. Discharge is light yellow and both watery and mucus. States she had a lot this morning and now she sees it when using the bathroom. No bleeding. +FM  Onset of complaint: yesterday  Pain score: 4/10  Vitals:   02/15/19 1131  BP: 107/80  Pulse: 98  Resp: 16  Temp: 98.1 F (36.7 C)  SpO2: 100%     FHT: +FM  Lab orders placed from triage: none

## 2019-02-15 NOTE — MAU Provider Note (Signed)
S: Monica [name] is a 37 y.o. G2P1001 at [redacted]w[redacted]d  who presents to MAU today complaining of leaking of fluid since yesterday.  She denies vaginal bleeding. She endorses contractions. She reports normal fetal movement.    O: BP 107/80 (BP Location: Right Arm)   Pulse 98   Temp 98.1 F (36.7 C) (Oral)   Resp 16   Ht 5\' 4"  (1.626 m)   Wt 66.5 kg   LMP 06/02/2018   SpO2 100%   BMI 25.16 kg/m  GENERAL: Well-developed, well-nourished female in no acute distress.  HEAD: Normocephalic, atraumatic.  CHEST: Normal effort of breathing, regular heart rate ABDOMEN: Soft, nontender, gravid PELVIC: Normal external female genitalia. Vagina is pink and rugated. Cervix with normal contour, no lesions. Normal discharge.  Negative pooling.   Cervical exam:   Dilation: 3 Effacement (%): 70 Cervical Position: Posterior Station: Ballotable Presentation: Vertex Exam by:: Erasmo Score RNC  Fetal Monitoring: Baseline: 125 bpm Variability: Moderate  Accelerations: 15x15 Decelerations: None Contractions: Occasional   Results for orders placed or performed during the hospital encounter of 02/15/19 (from the past 24 hour(s))  POCT fern test     Status: None   Collection Time: 02/15/19 12:23 PM  Result Value Ref Range   POCT Fern Test Negative = intact amniotic membranes     A: [facility] at [redacted]w[redacted]d  Membranes intact  Cervical exam unchanged from office visit.  Reactive NST   P: Report given to RN to contact MD on call for further instructions  Gurjit Loconte, Artist Pais, NP 02/15/2019 12:33 PM

## 2019-02-15 NOTE — Discharge Instructions (Signed)
Braxton Hicks Contractions Contractions of the uterus can occur throughout pregnancy, but they are not always a sign that you are in labor. You may have practice contractions called Braxton Hicks contractions. These false labor contractions are sometimes confused with true labor. What are Braxton Hicks contractions? Braxton Hicks contractions are tightening movements that occur in the muscles of the uterus before labor. Unlike true labor contractions, these contractions do not result in opening (dilation) and thinning of the cervix. Toward the end of pregnancy (32-34 weeks), Braxton Hicks contractions can happen more often and may become stronger. These contractions are sometimes difficult to tell apart from true labor because they can be very uncomfortable. You should not feel embarrassed if you go to the hospital with false labor. Sometimes, the only way to tell if you are in true labor is for your health care provider to look for changes in the cervix. The health care provider will do a physical exam and may monitor your contractions. If you are not in true labor, the exam should show that your cervix is not dilating and your water has not broken. If there are no other health problems associated with your pregnancy, it is completely safe for you to be sent home with false labor. You may continue to have Braxton Hicks contractions until you go into true labor. How to tell the difference between true labor and false labor True labor  Contractions last 30-70 seconds.  Contractions become very regular.  Discomfort is usually felt in the top of the uterus, and it spreads to the lower abdomen and low back.  Contractions do not go away with walking.  Contractions usually become more intense and increase in frequency.  The cervix dilates and gets thinner. False labor  Contractions are usually shorter and not as strong as true labor contractions.  Contractions are usually irregular.  Contractions  are often felt in the front of the lower abdomen and in the groin.  Contractions may go away when you walk around or change positions while lying down.  Contractions get weaker and are shorter-lasting as time goes on.  The cervix usually does not dilate or become thin. Follow these instructions at home:   Take over-the-counter and prescription medicines only as told by your health care provider.  Keep up with your usual exercises and follow other instructions from your health care provider.  Eat and drink lightly if you think you are going into labor.  If Braxton Hicks contractions are making you uncomfortable: ? Change your position from lying down or resting to walking, or change from walking to resting. ? Sit and rest in a tub of warm water. ? Drink enough fluid to keep your urine pale yellow. Dehydration may cause these contractions. ? Do slow and deep breathing several times an hour.  Keep all follow-up prenatal visits as told by your health care provider. This is important. Contact a health care provider if:  You have a fever.  You have continuous pain in your abdomen. Get help right away if:  Your contractions become stronger, more regular, and closer together.  You have fluid leaking or gushing from your vagina.  You pass blood-tinged mucus (bloody show).  You have bleeding from your vagina.  You have low back pain that you never had before.  You feel your baby's head pushing down and causing pelvic pressure.  Your baby is not moving inside you as much as it used to. Summary  Contractions that occur before labor are   called Braxton Hicks contractions, false labor, or practice contractions.  Braxton Hicks contractions are usually shorter, weaker, farther apart, and less regular than true labor contractions. True labor contractions usually become progressively stronger and regular, and they become more frequent.  Manage discomfort from Braxton Hicks contractions  by changing position, resting in a warm bath, drinking plenty of water, or practicing deep breathing. This information is not intended to replace advice given to you by your health care provider. Make sure you discuss any questions you have with your health care provider. Document Released: 12/27/2016 Document Revised: 05/28/2017 Document Reviewed: 12/27/2016 Elsevier Interactive Patient Education  2019 Elsevier Inc[employer]  

## 2019-02-17 ENCOUNTER — Other Ambulatory Visit: Payer: Self-pay

## 2019-02-17 ENCOUNTER — Inpatient Hospital Stay (HOSPITAL_COMMUNITY)
Admission: AD | Admit: 2019-02-17 | Discharge: 2019-02-19 | DRG: 807 | Disposition: A | Discharge status: Home / Self Care | Payer: BC Managed Care – PPO | Attending: Obstetrics and Gynecology | Admitting: Obstetrics and Gynecology

## 2019-02-17 DIAGNOSIS — Z3A37 37 weeks gestation of pregnancy: Secondary | ICD-10-CM

## 2019-02-17 DIAGNOSIS — Z1159 Encounter for screening for other viral diseases: Secondary | ICD-10-CM

## 2019-02-18 ENCOUNTER — Encounter (HOSPITAL_COMMUNITY): Payer: Self-pay

## 2019-02-18 ENCOUNTER — Other Ambulatory Visit: Payer: Self-pay

## 2019-02-18 ENCOUNTER — Inpatient Hospital Stay (HOSPITAL_COMMUNITY): Payer: BC Managed Care – PPO | Admitting: Anesthesiology

## 2019-02-18 DIAGNOSIS — O26893 Other specified pregnancy related conditions, third trimester: Secondary | ICD-10-CM | POA: Diagnosis present

## 2019-02-18 DIAGNOSIS — Z1159 Encounter for screening for other viral diseases: Secondary | ICD-10-CM | POA: Diagnosis not present

## 2019-02-18 DIAGNOSIS — Z3A37 37 weeks gestation of pregnancy: Secondary | ICD-10-CM | POA: Diagnosis not present

## 2019-02-18 LAB — CBC
HCT: 35.3 % — ABNORMAL LOW (ref 36.0–46.0)
HCT: 32.8 % — ABNORMAL LOW (ref 36.0–46.0)
Hemoglobin: 12 g/dL (ref 12.0–15.0)
Hemoglobin: 11 g/dL — ABNORMAL LOW (ref 12.0–15.0)
MCH: 30.4 pg (ref 26.0–34.0)
MCH: 30.1 pg (ref 26.0–34.0)
MCHC: 34 g/dL (ref 30.0–36.0)
MCHC: 33.5 g/dL (ref 30.0–36.0)
MCV: 89.4 fL (ref 80.0–100.0)
MCV: 89.6 fL (ref 80.0–100.0)
Platelets: 232 10*3/uL (ref 150–400)
Platelets: 230 10*3/uL (ref 150–400)
RBC: 3.95 MIL/uL (ref 3.87–5.11)
RBC: 3.66 MIL/uL — ABNORMAL LOW (ref 3.87–5.11)
RDW: 11.8 % (ref 11.5–15.5)
RDW: 11.7 % (ref 11.5–15.5)
WBC: 17.3 10*3/uL — ABNORMAL HIGH (ref 4.0–10.5)
WBC: 20.5 10*3/uL — ABNORMAL HIGH (ref 4.0–10.5)
nRBC: 0 % (ref 0.0–0.2)
nRBC: 0 % (ref 0.0–0.2)

## 2019-02-18 LAB — RPR: RPR Ser Ql: NONREACTIVE

## 2019-02-18 LAB — ABO/RH: ABO/RH(D): A POS

## 2019-02-18 LAB — SARS CORONAVIRUS 2 BY RT PCR (HOSPITAL ORDER, PERFORMED IN ~~LOC~~ HOSPITAL LAB): SARS Coronavirus 2: NEGATIVE

## 2019-02-18 MED ORDER — LACTATED RINGERS IV SOLN: 500.0000 mL | Freq: Once | INTRAVENOUS | Status: DC

## 2019-02-18 MED ORDER — SIMETHICONE 80 MG PO CHEW: 80.0000 mg | CHEWABLE_TABLET | ORAL | Status: DC | PRN

## 2019-02-18 MED ORDER — OXYCODONE-ACETAMINOPHEN 5-325 MG PO TABS: 1.0000 | ORAL_TABLET | ORAL | Status: DC | PRN

## 2019-02-18 MED ORDER — PRENATAL MULTIVITAMIN CH
1.0000 | ORAL_TABLET | Freq: Every day | ORAL | Status: DC
  Administered 2019-02-18: 1 via ORAL
  Filled 2019-02-18: qty 1

## 2019-02-18 MED ORDER — DIPHENHYDRAMINE HCL 25 MG PO CAPS: 25.0000 mg | ORAL_CAPSULE | Freq: Four times a day (QID) | ORAL | Status: DC | PRN

## 2019-02-18 MED ORDER — COCONUT OIL OIL: 1.0000 "application " | TOPICAL_OIL | Status: DC | PRN

## 2019-02-18 MED ORDER — ONDANSETRON HCL 4 MG/2ML IJ SOLN: 4.0000 mg | INTRAMUSCULAR | Status: DC | PRN

## 2019-02-18 MED ORDER — DIPHENHYDRAMINE HCL 50 MG/ML IJ SOLN: 12.5000 mg | INTRAMUSCULAR | Status: DC | PRN

## 2019-02-18 MED ORDER — ACETAMINOPHEN 325 MG PO TABS: 650.0000 mg | ORAL_TABLET | ORAL | Status: DC | PRN

## 2019-02-18 MED ORDER — EPHEDRINE 5 MG/ML INJ
10.0000 mg | INTRAVENOUS | Status: DC | PRN
  Filled 2019-02-18: qty 2

## 2019-02-18 MED ORDER — FLEET ENEMA 7-19 GM/118ML RE ENEM: 1.0000 | ENEMA | RECTAL | Status: DC | PRN

## 2019-02-18 MED ORDER — DIBUCAINE (PERIANAL) 1 % EX OINT: 1.0000 "application " | TOPICAL_OINTMENT | CUTANEOUS | Status: DC | PRN

## 2019-02-18 MED ORDER — ONDANSETRON HCL 4 MG PO TABS: 4.0000 mg | ORAL_TABLET | ORAL | Status: DC | PRN

## 2019-02-18 MED ORDER — SENNOSIDES-DOCUSATE SODIUM 8.6-50 MG PO TABS
2.0000 | ORAL_TABLET | ORAL | Status: DC
  Administered 2019-02-19: 2 via ORAL
  Filled 2019-02-18: qty 2

## 2019-02-18 MED ORDER — PHENYLEPHRINE 40 MCG/ML (10ML) SYRINGE FOR IV PUSH (FOR BLOOD PRESSURE SUPPORT)
80.0000 ug | PREFILLED_SYRINGE | INTRAVENOUS | Status: DC | PRN
  Filled 2019-02-18: qty 10

## 2019-02-18 MED ORDER — IBUPROFEN 600 MG PO TABS
600.0000 mg | ORAL_TABLET | Freq: Four times a day (QID) | ORAL | Status: DC
  Administered 2019-02-18 – 2019-02-19 (×5): 600 mg via ORAL
  Filled 2019-02-18 (×5): qty 1

## 2019-02-18 MED ORDER — OXYTOCIN BOLUS FROM INFUSION
500.0000 mL | Freq: Once | INTRAVENOUS | Status: AC
  Administered 2019-02-18: 500 mL via INTRAVENOUS

## 2019-02-18 MED ORDER — LIDOCAINE HCL (PF) 1 % IJ SOLN: 30.0000 mL | INTRAMUSCULAR | Status: DC | PRN

## 2019-02-18 MED ORDER — ZOLPIDEM TARTRATE 5 MG PO TABS: 5.0000 mg | ORAL_TABLET | Freq: Every evening | ORAL | Status: DC | PRN

## 2019-02-18 MED ORDER — SOD CITRATE-CITRIC ACID 500-334 MG/5ML PO SOLN: 30.0000 mL | ORAL | Status: DC | PRN

## 2019-02-18 MED ORDER — ONDANSETRON HCL 4 MG/2ML IJ SOLN: 4.0000 mg | Freq: Four times a day (QID) | INTRAMUSCULAR | Status: DC | PRN

## 2019-02-18 MED ORDER — SODIUM CHLORIDE (PF) 0.9 % IJ SOLN
INTRAMUSCULAR | Status: DC | PRN
  Administered 2019-02-18: 14 mL/h via EPIDURAL

## 2019-02-18 MED ORDER — LACTATED RINGERS IV SOLN
500.0000 mL | INTRAVENOUS | Status: DC | PRN
  Administered 2019-02-18: 1000 mL via INTRAVENOUS

## 2019-02-18 MED ORDER — BENZOCAINE-MENTHOL 20-0.5 % EX AERO
1.0000 "application " | INHALATION_SPRAY | CUTANEOUS | Status: DC | PRN
  Administered 2019-02-18: 1 via TOPICAL
  Filled 2019-02-18: qty 56

## 2019-02-18 MED ORDER — TETANUS-DIPHTH-ACELL PERTUSSIS 5-2.5-18.5 LF-MCG/0.5 IM SUSP: 0.5000 mL | Freq: Once | INTRAMUSCULAR | Status: DC

## 2019-02-18 MED ORDER — LIDOCAINE HCL (PF) 1 % IJ SOLN
INTRAMUSCULAR | Status: DC | PRN
  Administered 2019-02-18 (×2): 5 mL via EPIDURAL

## 2019-02-18 MED ORDER — OXYCODONE-ACETAMINOPHEN 5-325 MG PO TABS: 2.0000 | ORAL_TABLET | ORAL | Status: DC | PRN

## 2019-02-18 MED ORDER — OXYTOCIN 40 UNITS IN NORMAL SALINE INFUSION - SIMPLE MED
2.5000 [IU]/h | INTRAVENOUS | Status: DC
  Filled 2019-02-18: qty 1000

## 2019-02-18 MED ORDER — LACTATED RINGERS IV SOLN
INTRAVENOUS | Status: DC
  Administered 2019-02-18: 03:00:00 via INTRAVENOUS

## 2019-02-18 MED ORDER — FENTANYL-BUPIVACAINE-NACL 0.5-0.125-0.9 MG/250ML-% EP SOLN
12.0000 mL/h | EPIDURAL | Status: DC | PRN
  Filled 2019-02-18: qty 250

## 2019-02-18 MED ORDER — WITCH HAZEL-GLYCERIN EX PADS: 1.0000 "application " | MEDICATED_PAD | CUTANEOUS | Status: DC | PRN

## 2019-02-18 NOTE — Anesthesia Postprocedure Evaluation (Signed)
Anesthesia Post Note  Patient: Monica [name]  Procedure(s) Performed: AN AD [place]     Patient location during evaluation: Mother Baby Anesthesia Type: Epidural Level of consciousness: awake and alert Pain management: pain level controlled Vital Signs Assessment: post-procedure vital signs reviewed and stable Respiratory status: spontaneous breathing, nonlabored ventilation and respiratory function stable Cardiovascular status: stable Postop Assessment: no headache, no backache, epidural receding, no apparent nausea or vomiting, patient able to bend at knees, adequate PO intake and able to ambulate Anesthetic complications: no    Last Vitals:  Vitals:   02/18/19 0540 02/18/19 0634  BP: 100/65 97/68  Pulse: 67 63  Resp: 16 18  Temp: 36.7 C 36.6 C  SpO2: 99% 100%    Last Pain:  Vitals:   02/18/19 0634  TempSrc: Oral  PainSc: 0-No pain   Pain Goal: Patients Stated Pain Goal: 0 (02/18/19 0056)              Epidural/Spinal Function Cutaneous sensation: Normal sensation (02/18/19 0634)  Jabier Mutton

## 2019-02-18 NOTE — MAU Note (Signed)
Covid swab obtained without difficulty. PT tol well. No symptoms 

## 2019-02-18 NOTE — Progress Notes (Signed)
Patient ID: Monica [name], female   DOB: 06-26-1981, 37 y.o.   MRN: [id number] Received epidural and now trying to wait on husband to arrive. Waiting on sitter but close by  afeb VSS FHR category 1  C/rim/0 and BBOW  Will hold off on AROM and see if husband can arrive since pt comfortable

## 2019-02-18 NOTE — Progress Notes (Signed)
Patient ID: Monica [name], female   DOB: Feb 27, 1981, 38 y.o.   MRN: [id number] PPD#0 Pt doing well with no complaints. Bonding well with baby. Pain well controlled. Ambulating and voiding well. No CP, SOB or fever VSS GEN - NAD ABD - FF EXT - no edema  A/P: PPD#0 - stable         Desires circumcision in hospital tomorrow         Routine pp care

## 2019-02-18 NOTE — MAU Note (Signed)
Ctxs all day. Lost mucous plug earlier. Ctxs stronger since 2130. 3cm last sve

## 2019-02-18 NOTE — Progress Notes (Signed)
Dr Marvel Plan notified of pt's admission and status. Aware of ctx pattern, sve, report of baby having decreased FM today. Will admit to [facility]

## 2019-02-18 NOTE — Anesthesia Preprocedure Evaluation (Signed)
Anesthesia Evaluation  Patient identified by MRN, date of birth, ID band Patient awake    Reviewed: Allergy & Precautions, NPO status , Patient's Chart, lab work & pertinent test results  Airway Mallampati: II  TM Distance: >3 FB Neck ROM: Full    Dental no notable dental hx. (+) Dental Advisory Given   Pulmonary neg pulmonary ROS,    Pulmonary exam normal        Cardiovascular negative cardio ROS Normal cardiovascular exam     Neuro/Psych negative neurological ROS  negative psych ROS   GI/Hepatic negative GI ROS, Neg liver ROS,   Endo/Other  negative endocrine ROS  Renal/GU negative Renal ROS  negative genitourinary   Musculoskeletal negative musculoskeletal ROS (+)   Abdominal   Peds negative pediatric ROS (+)  Hematology negative hematology ROS (+)   Anesthesia Other Findings   Reproductive/Obstetrics (+) Pregnancy                             Anesthesia Physical Anesthesia Plan  ASA: II  Anesthesia Plan: Epidural   Post-op Pain Management:    Induction:   PONV Risk Score and Plan:   Airway Management Planned: Natural Airway  Additional Equipment:   Intra-op Plan:   Post-operative Plan:   Informed Consent: I have reviewed the patients History and Physical, chart, labs and discussed the procedure including the risks, benefits and alternatives for the proposed anesthesia with the patient or authorized representative who has indicated his/her understanding and acceptance.     Dental advisory given  Plan Discussed with: Anesthesiologist  Anesthesia Plan Comments:         Anesthesia Quick Evaluation  

## 2019-02-18 NOTE — H&P (Signed)
Monica [name] is a 38 y.o. female G2 P1001 at 46 2/7 weeks (EDD 03/09/19 by LMP c/w 10 week [place]) presenting for regular painful contractions for several hours. Prenatal care significant for AMA with low risk Panorama and preterm contractions treated with oral procardia.  Cervix on last check in office 3cm and presenting in active labor at 7cm.  OB History    Gravida  2   Para  1   Term  1   Preterm      AB      Living  1     SAB      TAB      Ectopic      Multiple  0   Live Births  1         NSVD 2016 6#3oz  Past Medical History:  Diagnosis Date  . Hx of varicella   . Urinary tract disease    Past Surgical History:  Procedure Laterality Date  . WISDOM TOOTH EXTRACTION     Family History: family history includes Cancer in her mother; Hyperlipidemia in her father; Hypertension in her father; Varicose Veins in her paternal grandmother. Social History:  reports that she has never smoked. She has never used smokeless tobacco. She reports previous alcohol use. She reports that she does not use drugs.     Maternal Diabetes: No Genetic Screening: Normal Maternal Ultrasounds/Referrals: Normal Fetal Ultrasounds or other Referrals:  Referred to Materal Fetal Medicine to clear anatomy and WNL Maternal Substance Abuse:  No Significant Maternal Medications:  None Significant Maternal Lab Results:  None Other Comments:  None  Review of Systems  Constitutional: Negative for fever.  Eyes: Negative for blurred vision.  Gastrointestinal: Positive for abdominal pain.   Maternal Medical History:  Reason for admission: Contractions.   Contractions: Onset was 3-5 hours ago.   Frequency: regular.   Perceived severity is moderate.    Fetal activity: Perceived fetal activity is normal.        Last menstrual period 06/02/2018, unknown if currently breastfeeding. Maternal Exam:  Uterine Assessment: Contraction strength is moderate.  Contraction frequency is  regular.   Abdomen: Patient reports no abdominal tenderness. Fetal presentation: vertex  Introitus: Normal vulva. Normal vagina.    Physical Exam  Constitutional: She appears well-developed.  Cardiovascular: Normal rate and regular rhythm.  Respiratory: Effort normal.  Genitourinary:    Vulva and vagina normal.   Neurological: She is alert.  Psychiatric: She has a normal mood and affect.    Prenatal labs: ABO, Rh:  A positive Antibody:  negative Rubella:  Immune RPR:   NR HBsAg:   Neg HIV:   NR GBS:   Neg Panorama low risk One hour GCT 135, three hour WNL   Assessment/Plan: Pt with painful contractions and cervix in office 3cm last visit, now 7cm and active labor.  Admitted and will try to get her epidural.   Logan Bores 02/18/2019, 12:01 AM

## 2019-02-18 NOTE — Anesthesia Procedure Notes (Signed)
Epidural Patient location during procedure: OB Start time: 02/18/2019 2:21 AM End time: 02/18/2019 2:32 AM  Staffing Anesthesiologist: Duane Boston, MD Performed: anesthesiologist   Preanesthetic Checklist Completed: patient identified, site marked, pre-op evaluation, timeout performed, IV checked, risks and benefits discussed and monitors and equipment checked  Epidural Patient position: sitting Prep: DuraPrep Patient monitoring: heart rate, cardiac monitor, continuous pulse ox and blood pressure Approach: midline Location: L2-L3 Injection technique: LOR saline  Needle:  Needle type: Tuohy  Needle gauge: 17 G Needle length: 9 cm Needle insertion depth: 5 cm Catheter size: 20 Guage Catheter at skin depth: 10 cm Test dose: negative and Other  Assessment Events: blood not aspirated, injection not painful, no injection resistance and negative IV test  Additional Notes Informed consent obtained prior to proceeding including risk of failure, 1% risk of PDPH, risk of minor discomfort and bruising.  Discussed rare but serious complications including epidural abscess, permanent nerve injury, epidural hematoma.  Discussed alternatives to epidural analgesia and patient desires to proceed.  Timeout performed pre-procedure verifying patient name, procedure, and platelet count.  Patient tolerated procedure well.

## 2019-02-19 LAB — TYPE AND SCREEN
ABO/RH(D): A POS
Antibody Screen: NEGATIVE

## 2019-02-19 MED ORDER — IBUPROFEN 600 MG PO TABS: 600.0000 mg | ORAL_TABLET | Freq: Four times a day (QID) | ORAL | 0 refills | Status: DC

## 2019-02-19 NOTE — Progress Notes (Signed)
PPD #1 No problems, wants to go home Afeb, VSS Fundus firm, NT at U-1 Continue routine postpartum care, d/c home today 

## 2019-02-19 NOTE — Discharge Instructions (Signed)
As per discharge pamphlet °

## 2019-02-19 NOTE — Discharge Summary (Signed)
OB Discharge Summary     Patient Name: Monica [name] DOB: [birth year] MRN: [id number]  Date of admission: 02/17/2019 Delivering MD: Paula Compton   Date of discharge: 02/19/2019  Admitting diagnosis: 37WKS CTX Intrauterine pregnancy: [redacted]w[redacted]d     Secondary diagnosis:  Active Problems:   Indication for care in labor or delivery   NSVD (normal spontaneous vaginal delivery)      Discharge diagnosis: Term Pregnancy [place] Hospital course:  Onset of Labor With Vaginal Delivery     38 y.o. yo G2P2002 at [redacted]w[redacted]d was admitted in Active Labor on 02/17/2019. Patient had an uncomplicated labor course as follows:  Membrane Rupture Time/Date: 3:14 AM ,02/18/2019   Intrapartum Procedures: Episiotomy: None [1]                                         Lacerations:  1st degree [2]  Patient had a delivery of a Viable infant. 02/18/2019  Information for the patient's newborn:  Kaylany, Tesoriero [[id number]]  Delivery Method: Vaginal, Spontaneous(Filed from Delivery Summary)     Pateint had an uncomplicated postpartum course.  She is ambulating, tolerating a regular diet, passing flatus, and urinating well. Patient is discharged home in stable condition on 02/19/19.   Physical exam  Vitals:   02/18/19 1419 02/18/19 1700 02/18/19 2128 02/19/19 0607  BP: 100/66 108/82 101/68 97/65  Pulse: 70 72 64 60  Resp: 16  18 18   Temp: 98.1 F (36.7 C) 98.7 F (37.1 C) 98 F (36.7 C) 98.4 F (36.9 C)  TempSrc: Oral Oral  Oral  SpO2: 100% 99% 97% 99%  Weight:      Height:       General: alert Lochia: appropriate Uterine Fundus: firm  Labs: Lab Results  Component Value Date   WBC 20.5 (H) 02/18/2019   HGB 11.0 (L) 02/18/2019   HCT 32.8 (L) 02/18/2019   MCV 89.6 02/18/2019   PLT 230 02/18/2019   CMP Latest Ref Rng & Units 07/15/2018  Glucose 70 - 99 mg/dL 95  BUN 6 - 20 mg/dL <5(L)  Creatinine 0.44 - 1.00 mg/dL 0.78  Sodium 135 - 145 mmol/L 135   Potassium 3.5 - 5.1 mmol/L 4.0  Chloride 98 - 111 mmol/L 107  CO2 22 - 32 mmol/L 22  Calcium 8.9 - 10.3 mg/dL 8.8(L)  Total Protein 6.5 - 8.1 g/dL 6.4(L)  Total Bilirubin 0.3 - 1.2 mg/dL 0.7  Alkaline Phos 38 - 126 U/L 37(L)  AST 15 - 41 U/L 14(L)  ALT 0 - 44 U/L 11    Discharge instruction: per After Visit Summary and "Baby and Me Booklet".  After visit meds:  Allergies as of 02/19/2019   No Known Allergies     Medication List    TAKE these medications   ibuprofen 600 MG tablet Commonly known as: ADVIL Take 1 tablet (600 mg total) by mouth every 6 (six) hours.   PRENATAL PO Take 1 tablet by mouth daily.   ranitidine 150 MG tablet Commonly known as: ZANTAC Take 1 tablet (150 mg total) by mouth 2 (two) times daily.       Diet: routine diet  Activity: Advance as tolerated. Pelvic rest for 6  weeks.   Outpatient follow up:6 weeks  Newborn Data: Live born female  Birth Weight: 6 lb 7.4 oz (2931 g) APGAR: 9, 9  Newborn Delivery   Birth date/time: 02/18/2019 03:45:00 Delivery type: Vaginal, Spontaneous      Baby Feeding: Breast Disposition:home with mother   02/19/2019 Zenaida Nieceodd D Chanley Mcenery, MD

## 2019-06-01 ENCOUNTER — Encounter (HOSPITAL_COMMUNITY): Payer: Self-pay | Admitting: *Deleted

## 2019-06-01 ENCOUNTER — Other Ambulatory Visit: Payer: Self-pay

## 2019-06-01 ENCOUNTER — Emergency Department (HOSPITAL_COMMUNITY)
Admission: EM | Admit: 2019-06-01 | Discharge: 2019-06-01 | Disposition: A | Discharge status: Home / Self Care | Payer: BC Managed Care – PPO | Attending: Emergency Medicine | Admitting: Emergency Medicine

## 2019-06-01 DIAGNOSIS — W01198A Fall on same level from slipping, tripping and stumbling with subsequent striking against other object, initial encounter: Secondary | ICD-10-CM | POA: Diagnosis not present

## 2019-06-01 DIAGNOSIS — Y939 Activity, unspecified: Secondary | ICD-10-CM | POA: Insufficient documentation

## 2019-06-01 DIAGNOSIS — Y999 Unspecified external cause status: Secondary | ICD-10-CM | POA: Diagnosis not present

## 2019-06-01 DIAGNOSIS — S0083XA Contusion of other part of head, initial encounter: Secondary | ICD-10-CM | POA: Diagnosis not present

## 2019-06-01 DIAGNOSIS — Y92002 Bathroom of unspecified non-institutional (private) residence single-family (private) house as the place of occurrence of the external cause: Secondary | ICD-10-CM | POA: Diagnosis not present

## 2019-06-01 DIAGNOSIS — S0990XA Unspecified injury of head, initial encounter: Secondary | ICD-10-CM | POA: Diagnosis present

## 2019-06-01 DIAGNOSIS — Z79899 Other long term (current) drug therapy: Secondary | ICD-10-CM | POA: Insufficient documentation

## 2019-06-01 DIAGNOSIS — W19XXXA Unspecified fall, initial encounter: Secondary | ICD-10-CM

## 2019-06-01 NOTE — ED Notes (Signed)
Pt given dc instructions pt verbalizes understanding.  

## 2019-06-01 NOTE — ED Triage Notes (Signed)
Pt was helping son in bathtub at 54 today and she slipped and smacked the R side of her face on the top of the toilet.  Redness noted to R side of face.  Denies loc.  C/o headache. Denies nausea.

## 2019-06-01 NOTE — ED Provider Notes (Signed)
[facility] EMERGENCY DEPARTMENT Provider Note   CSN: [id number] Arrival date & time: 06/01/19  1238     History   Chief Complaint Chief Complaint  Patient presents with  . Fall    headache    HPI Monica [name] is a 38 y.o. female.     HPI   39 year old female presents status post fall.  Patient notes that she slipped while in the bathroom today.  She notes she hit her right side of her face on the toilet that was covered in a towel.  No loss of consciousness no significant pain after the incident.  She denies any neck pain neurological deficits severe headache or any other discomfort.  She notes she developed a minor headache when she was trying to feed her kids and they were screaming today.  She is not on any medications no anticoagulants she did take ibuprofen before coming today.  No confusion, vomiting or amnesia.  She notes her teeth line up appropriately.  Past Medical History:  Diagnosis Date  . Hx of varicella   . Urinary tract disease     Patient Active Problem List   Diagnosis Date Noted  . Indication for care in labor or delivery 02/18/2019  . NSVD (normal spontaneous vaginal delivery) 02/18/2019  . Pregnancy 06/08/2015  . SVD (spontaneous vaginal delivery) 06/08/2015    Past Surgical History:  Procedure Laterality Date  . WISDOM TOOTH EXTRACTION       OB History    Gravida  2   Para  2   Term  2   Preterm      AB      Living  2     SAB      TAB      Ectopic      Multiple  0   Live Births  2            Home Medications    Prior to Admission medications   Medication Sig Start Date End Date Taking? Authorizing Provider  ibuprofen (ADVIL) 600 MG tablet Take 1 tablet (600 mg total) by mouth every 6 (six) hours. 02/19/19   Meisinger, Tawanna Cooler, MD  Prenatal Vit-Fe Fumarate-FA (PRENATAL PO) Take 1 tablet by mouth daily.    [provider]  ranitidine (ZANTAC) 150 MG tablet Take 1 tablet (150 mg total)  by mouth 2 (two) times daily. Patient not taking: Reported on 07/15/2018 02/26/18   Arnaldo Natal, MD    Family History Family History  Problem Relation Age of Onset  . Cancer Mother   . Hyperlipidemia Father   . Hypertension Father   . Varicose Veins Paternal Grandmother     Social History Social History   Tobacco Use  . Smoking status: Never Smoker  . Smokeless tobacco: Never Used  Substance Use Topics  . Alcohol use: Not Currently  . Drug use: Never     Allergies   Patient has no known allergies.   Review of Systems Review of Systems  All other systems reviewed and are negative.    Physical Exam Updated Vital Signs BP 126/71 (BP Location: Left Arm)   Pulse 88   Temp 98.1 F (36.7 C) (Oral)   Resp 16   Ht 5\' 4"  (1.626 m)   Wt 61.7 kg   SpO2 100%   BMI 23.34 kg/m   Physical Exam Vitals signs and nursing note reviewed.  Constitutional:      Appearance: She is well-developed.  HENT:  Head: Normocephalic and atraumatic.     Comments: Face with very minor erythema over the right cheek, no bony tenderness or signs of swelling or edema jaw full active range of motion, dentition aligned appropriately, no bruising to the face neck or head, no C spine tenderness to palpation neck full active range of motion Eyes:     General: No scleral icterus.       Right eye: No discharge.        Left eye: No discharge.     Conjunctiva/sclera: Conjunctivae normal.     Pupils: Pupils are equal, round, and reactive to light.  Neck:     Musculoskeletal: Normal range of motion.     Vascular: No JVD.     Trachea: No tracheal deviation.  Pulmonary:     Effort: Pulmonary effort is normal.     Breath sounds: No stridor.  Neurological:     General: No focal deficit present.     Mental Status: She is alert and oriented to person, place, and time.     Cranial Nerves: No cranial nerve deficit.     Sensory: No sensory deficit.     Motor: No weakness.     Coordination:  Coordination normal.     Gait: Gait normal.  Psychiatric:        Behavior: Behavior normal.        Thought Content: Thought content normal.        Judgment: Judgment normal.      ED Treatments / Results  Labs (all labs ordered are listed, but only abnormal results are displayed) Labs Reviewed - No data to display  EKG None  Radiology No results found.  Procedures Procedures (including critical care time)  Medications Ordered in ED Medications - No data to display   Initial Impression / Assessment and Plan / ED Course  I have reviewed the triage vital signs and the nursing notes.  Pertinent labs & imaging results that were available during my care of the patient were reviewed by me and considered in my medical decision making (see chart for details).        38 year old female presents status post fall.  She has no signs of significant bony or intracranial abnormality.  No indication for imaging at this time.  Discharged with symptomatic care and strict return precautions.  She verbalized understanding and agreement to today's plan had no further questions or concerns at the time of discharge.  Final Clinical Impressions(s) / ED Diagnoses   Final diagnoses:  Fall, initial encounter  Contusion of face, initial encounter    ED Discharge Orders    None       Francee Gentile 06/01/19 1412    Lajean Saver, MD 06/01/19 [phone]

## 2019-06-01 NOTE — Discharge Instructions (Addendum)
Please read attached information. If you experience any new or worsening signs or symptoms please return to the emergency room for evaluation. Please follow-up with your primary care provider or specialist as discussed.  °

## 2020-01-07 ENCOUNTER — Ambulatory Visit (HOSPITAL_COMMUNITY)
Admission: EM | Admit: 2020-01-07 | Discharge: 2020-01-07 | Disposition: A | Discharge status: Home / Self Care | Payer: BC Managed Care – PPO | Attending: Family | Admitting: Family

## 2020-01-07 ENCOUNTER — Other Ambulatory Visit: Payer: Self-pay

## 2020-01-07 ENCOUNTER — Encounter (HOSPITAL_COMMUNITY): Payer: Self-pay

## 2020-01-07 DIAGNOSIS — Z20822 Contact with and (suspected) exposure to covid-19: Secondary | ICD-10-CM | POA: Diagnosis not present

## 2020-01-07 DIAGNOSIS — J029 Acute pharyngitis, unspecified: Secondary | ICD-10-CM | POA: Insufficient documentation

## 2020-01-07 LAB — POCT RAPID STREP A: Streptococcus, Group A Screen (Direct): NEGATIVE

## 2020-01-07 MED ORDER — LIDOCAINE VISCOUS HCL 2 % MT SOLN
10.0000 mL | OROMUCOSAL | 0 refills | Status: DC | PRN
Start: 2020-01-07 — End: 2020-05-31

## 2020-01-07 NOTE — ED Provider Notes (Signed)
[place]    CSN: [id number] Arrival date & time: 01/07/20  1815      History   Chief Complaint Chief Complaint  Patient presents with  . Sore Throat    HPI Monica [name] is a 39 y.o. female.   39 year old female presents with sore throat and difficulty swallowing for over 1 week. Denies any fever, coughing or GI symptoms. Has had slight nasal congestion but has seasonal allergies so uncertain if it is making her sore throat worse. Has been taking cough drops with some relief and has tried Tylenol and Advil with minimal relief. Son just started daycare last week and developed a fever and cough at the end of last week. Other family members have also had mild fevers, sore throats and cough but her son is better now. Her son did not have any testing performed. No known exposure to COVID 19. Is fully vaccinated. No other chronic health issues. Takes multivitamin daily.   The history is provided by the patient.    Past Medical History:  Diagnosis Date  . Hx of varicella   . Urinary tract disease     Patient Active Problem List   Diagnosis Date Noted  . Indication for care in labor or delivery 02/18/2019  . NSVD (normal spontaneous vaginal delivery) 02/18/2019  . Pregnancy 06/08/2015  . SVD (spontaneous vaginal delivery) 06/08/2015    Past Surgical History:  Procedure Laterality Date  . WISDOM TOOTH EXTRACTION      OB History    Gravida  2   Para  2   Term  2   Preterm      AB      Living  2     SAB      TAB      Ectopic      Multiple  0   Live Births  2            Home Medications    Prior to Admission medications   Medication Sig Start Date End Date Taking? Authorizing Provider  Multiple Vitamin (MULTIVITAMIN) tablet Take 1 tablet by mouth daily.   Yes [provider]  lidocaine (XYLOCAINE) 2 % solution Use as directed 10 mLs in the mouth or throat every 3 (three) hours as needed (for sore throat). Spit out- do  not swallow 01/07/20   Safiatou Islam, Nicholes Stairs, NP  ranitidine (ZANTAC) 150 MG tablet Take 1 tablet (150 mg total) by mouth 2 (two) times daily. Patient not taking: Reported on 07/15/2018 02/26/18 01/07/20  Harrie Foreman, MD    Family History Family History  Problem Relation Age of Onset  . Cancer Mother   . Hyperlipidemia Father   . Hypertension Father   . Varicose Veins Paternal Grandmother     Social History Social History   Tobacco Use  . Smoking status: Never Smoker  . Smokeless tobacco: Never Used  Substance Use Topics  . Alcohol use: Not Currently  . Drug use: Never     Allergies   Patient has no known allergies.   Review of Systems Review of Systems  Constitutional: Negative for activity change, appetite change, chills, fatigue and fever.  HENT: Positive for congestion (mild nasal), ear pain (ear fullness when swallowing), postnasal drip, sore throat and trouble swallowing. Negative for ear discharge, facial swelling, mouth sores, nosebleeds, rhinorrhea, sinus pressure, sinus pain and sneezing.   Eyes: Negative for photophobia, pain, discharge, redness, itching and visual disturbance.  Respiratory: Negative for  cough, chest tightness, shortness of breath and wheezing.   Gastrointestinal: Negative for diarrhea, nausea and vomiting.  Musculoskeletal: Negative for arthralgias, myalgias, neck pain and neck stiffness.  Skin: Negative for color change, rash and wound.  Allergic/Immunologic: Positive for environmental allergies. Negative for food allergies and immunocompromised state.  Neurological: Negative for dizziness, tremors, seizures, syncope, weakness, light-headedness, numbness and headaches.  Hematological: Positive for adenopathy. Does not bruise/bleed easily.     Physical Exam Triage Vital Signs ED Triage Vitals [01/07/20 1844]  Enc Vitals Group     BP 122/76     Pulse Rate 89     Resp 16     Temp 98 F (36.7 C)     Temp Source Oral     SpO2 99 %      Weight 127 lb (57.6 kg)     Height 5\' 4"  (1.626 m)     Head Circumference      Peak Flow      Pain Score 6     Pain Loc      Pain Edu?      Excl. in GC?    No data found.  Updated Vital Signs BP 122/76   Pulse 89   Temp 98 F (36.7 C) (Oral)   Resp 16   Ht 5\' 4"  (1.626 m)   Wt 127 lb (57.6 kg)   SpO2 99%   BMI 21.80 kg/m   Visual Acuity Right Eye Distance:   Left Eye Distance:   Bilateral Distance:    Right Eye Near:   Left Eye Near:    Bilateral Near:     Physical Exam Vitals and nursing note reviewed.  Constitutional:      General: She is awake. She is not in acute distress.    Appearance: She is well-developed and well-groomed.     Comments: She is sitting comfortably on the exam table in no acute distress but appears tired.   HENT:     Head: Normocephalic and atraumatic.     Right Ear: Hearing, ear canal and external ear normal. Tympanic membrane is bulging.     Left Ear: Hearing, ear canal and external ear normal. Tympanic membrane is bulging.     Nose: Congestion (slight clear nasal) present. No rhinorrhea.     Right Sinus: No maxillary sinus tenderness or frontal sinus tenderness.     Left Sinus: No maxillary sinus tenderness or frontal sinus tenderness.     Mouth/Throat:     Lips: Pink.     Mouth: Mucous membranes are moist. No oral lesions.     Pharynx: Uvula midline. Posterior oropharyngeal erythema present. No pharyngeal swelling, oropharyngeal exudate or uvula swelling.     Tonsils: No tonsillar exudate. 2+ on the right. 2+ on the left.  Eyes:     Extraocular Movements: Extraocular movements intact.     Conjunctiva/sclera: Conjunctivae normal.     Pupils: Pupils are equal, round, and reactive to light.  Cardiovascular:     Rate and Rhythm: Normal rate and regular rhythm.     Heart sounds: Normal heart sounds. No murmur.  Pulmonary:     Effort: Pulmonary effort is normal. No respiratory distress.     Breath sounds: Normal breath sounds and air  entry. No stridor or decreased air movement. No decreased breath sounds, wheezing or rhonchi.  Musculoskeletal:     Cervical back: Normal range of motion and neck supple.  Lymphadenopathy:     Cervical: Cervical adenopathy present.  Right cervical: Superficial cervical adenopathy present.     Left cervical: Superficial cervical adenopathy present.  Skin:    General: Skin is warm and dry.     Capillary Refill: Capillary refill takes less than 2 seconds.     Findings: No rash.  Neurological:     General: No focal deficit present.     Mental Status: She is alert and oriented to person, place, and time.  Psychiatric:        Mood and Affect: Mood normal.        Behavior: Behavior normal. Behavior is cooperative.        Thought Content: Thought content normal.        Judgment: Judgment normal.      UC Treatments / Results  Labs (all labs ordered are listed, but only abnormal results are displayed) Labs Reviewed  SARS CORONAVIRUS 2 (TAT 6-24 HRS)  CULTURE, GROUP A STREP [facility])  POCT RAPID STREP A    EKG   Radiology No results found.  Procedures Procedures (including critical care time)  Medications Ordered in UC Medications - No data to display  Initial Impression / Assessment and Plan / UC Course  I have reviewed the triage vital signs and the nursing notes.  Pertinent labs & imaging results that were available during my care of the patient were reviewed by me and considered in my medical decision making (see chart for details).    Reviewed negative rapid strep test with patient. Discussed that her symptoms may be viral- will test for COVID 19. Recommend trial Viscous Lidocaine 2 teaspoons every 3 to 4 hours as needed. May trial OTC Aleve 1 to 2 tablets every 12 hours as needed. May continue Tylenol 1000mg  every 8 hours as needed. Rest. Push fluids to maintain hydration. Follow-up pending COVID 19 test results and in 3 to 4 days with her PCP if symptoms are not  improving.  Final Clinical Impressions(s) / UC Diagnoses   Final diagnoses:  Sore throat     Discharge Instructions     Recommend use Viscous lidocaine- 2 teaspoons- swish and spit out every 3 to 4 hours as needed for throat pain. May continue Ibuprofen or trial OTC Aleve 1-2 tablets every 12 hours as needed for pain. Rest. Continue to push fluids. Follow-up pending COVID 19 test results and in 3 to 4 days with your PCP if symptoms are not improving.     ED Prescriptions    Medication Sig Dispense Auth. Provider   lidocaine (XYLOCAINE) 2 % solution Use as directed 10 mLs in the mouth or throat every 3 (three) hours as needed (for sore throat). Spit out- do not swallow 100 mL Xee Hollman, , NP     PDMP not reviewed this encounter.   Ali Lowe, NP 01/08/20 [phone]

## 2020-01-07 NOTE — ED Triage Notes (Signed)
Pt c/o sore throat and [id number].

## 2020-01-07 NOTE — Discharge Instructions (Addendum)
Recommend use Viscous lidocaine- 2 teaspoons- swish and spit out every 3 to 4 hours as needed for throat pain. May continue Ibuprofen or trial OTC Aleve 1-2 tablets every 12 hours as needed for pain. Rest. Continue to push fluids. Follow-up pending COVID 19 test results and in 3 to 4 days with your PCP if symptoms are not improving.

## 2020-01-08 LAB — SARS CORONAVIRUS 2 (TAT 6-24 HRS): SARS Coronavirus 2: NEGATIVE

## 2020-01-10 LAB — CULTURE, GROUP A STREP (THRC)

## 2020-04-01 ENCOUNTER — Encounter: Payer: Self-pay | Admitting: Gastroenterology

## 2020-05-31 ENCOUNTER — Ambulatory Visit (INDEPENDENT_AMBULATORY_CARE_PROVIDER_SITE_OTHER): Payer: BC Managed Care – PPO | Admitting: Gastroenterology

## 2020-05-31 ENCOUNTER — Encounter: Payer: Self-pay | Admitting: Gastroenterology

## 2020-05-31 VITALS — BP 112/70 | HR 95 | Ht 64.0 in | Wt 125.0 lb

## 2020-05-31 DIAGNOSIS — K5909 Other constipation: Secondary | ICD-10-CM | POA: Diagnosis not present

## 2020-05-31 DIAGNOSIS — K6289 Other specified diseases of anus and rectum: Secondary | ICD-10-CM

## 2020-05-31 DIAGNOSIS — R634 Abnormal weight loss: Secondary | ICD-10-CM | POA: Diagnosis not present

## 2020-05-31 MED ORDER — PLENVU 140 G PO SOLR
140.0000 g | ORAL | 0 refills | Status: DC
Start: 2020-05-31 — End: 2020-07-13

## 2020-05-31 NOTE — Patient Instructions (Signed)
If you are age 39 or older, your body mass index should be between 23-30. Your Body mass index is 21.46 kg/m. If this is out of the aforementioned range listed, please consider follow up with your Primary Care Provider.  If you are age 50 or younger, your body mass index should be between 19-25. Your Body mass index is 21.46 kg/m. If this is out of the aformentioned range listed, please consider follow up with your Primary Care Provider.   You have been scheduled for a colonoscopy. Please follow written instructions given to you at your visit today.  Please pick up your prep supplies at the pharmacy within the next 1-3 days. If you use inhalers (even only as needed), please bring them with you on the day of your procedure.  It was a pleasure to see you today!  Dr. Myrtie Neither

## 2020-05-31 NOTE — Progress Notes (Signed)
Gastroenterology Consult Note:  History: Monica [name] Monica [name] 05/31/2020  Referring provider: Titus Mould, NP  Reason for consult/chief complaint: Constipation (onset end of June 2021. States she has a lot of straining. Also notices lower abd pain. Pain is infrequent. Increased fluid intake which has helped. Denies recyal bleeding. Denies use of OTC products for constipation. Did start a probiotic this week), Gas (occassional, onset June 2021), and Weight Loss (States she has always been consistantly 128# but has noticed that she is continually loosing weight. Unintentional)   Subjective  HPI: Recent primary care note noting patient describes recurrent bouts of constipation and is increasingly concerned about that.  Has some concerns about underlying cancer.  She has apparently been undergoing some physical therapy lately for musculoskeletal hip and pelvic pain suspected to be sacroiliitis.  Began after pregnancy, delivered her last child mid 2020. __________________   Monica [name] had some intermittent constipation with tendencies to bloating worse during pregnancies, but she did not consider it severe and felt it was manageable.  For the last several months she has had worsened constipation with straining for small pellet-like stools and some intermittent crampy lower abdominal pain.  She denies frank rectal bleeding but may have some on the paper, and has been using some over-the-counter constipation remedies and recently started a probiotic. She is bothered by bloating and gas and also feels that she has lost a few pounds in the last several months. She has couple coffee in the morning before work, and sometimes may have urgency for bowel movement on the way to work but has to hold it.  When she can finally go it might be loose. Regarding her constipation, she typically will go no longer than about 2 days, but during that time does get urgency for bowel movement, sits  for a while and feels that nothing will pass and she needs to strain.  She does not have anything prolapse from the rectum or vagina.  She became concerned because a physical therapist heard about her symptoms and asked her if she had cancer.  She had 2 children by SVD, had a tear with both, worse on the first delivery.  ROS:  Review of Systems  Constitutional: Negative for appetite change and unexpected weight change.  HENT: Negative for mouth sores and voice change.   Eyes: Negative for pain and redness.  Respiratory: Negative for cough and shortness of breath.   Cardiovascular: Negative for chest pain and palpitations.  Genitourinary: Negative for dysuria and hematuria.  Musculoskeletal: Positive for arthralgias and back pain. Negative for myalgias.  Skin: Negative for pallor and rash.  Neurological: Negative for weakness and headaches.  Hematological: Negative for adenopathy.     Past Medical History: Past Medical History:  Diagnosis Date  . H. pylori infection    early 20's  . Hx of varicella   . Urinary tract disease    Treated many years ago for H. pylori for what sounds like dyspeptic symptoms and also bloating.   Past Surgical History: Past Surgical History:  Procedure Laterality Date  . WISDOM TOOTH EXTRACTION       Family History: Family History  Problem Relation Age of Onset  . Breast cancer Mother   . Hyperlipidemia Father   . Hypertension Father   . Varicose Veins Paternal Grandmother   . Lung cancer Paternal Grandmother   . Healthy Brother   . Esophageal cancer Neg Hx   . Colon cancer Neg Hx   . Stomach  cancer Neg Hx   . Liver disease Neg Hx   . Pancreatic cancer Neg Hx     Social History: Social History   Socioeconomic History  . Marital status: Married    Spouse name: Not on file  . Number of children: Not on file  . Years of education: Not on file  . Highest education level: Not on file  Occupational History  . Not on file  Tobacco  Use  . Smoking status: Never Smoker  . Smokeless tobacco: Never Used  Vaping Use  . Vaping Use: Never used  Substance and Sexual Activity  . Alcohol use: Yes    Comment: occ glass of wine   . Drug use: Never  . Sexual activity: Yes    Partners: Male    Birth control/protection: None  Other Topics Concern  . Not on file  Social History Narrative  . Not on file   Social Determinants of Health   Financial Resource Strain:   . Difficulty of Paying Living Expenses: Not on file  Food Insecurity:   . Worried About Programme researcher, broadcasting/film/video in the Last Year: Not on file  . Ran Out of Food in the Last Year: Not on file  Transportation Needs:   . Lack of Transportation (Medical): Not on file  . Lack of Transportation (Non-Medical): Not on file  Physical Activity:   . Days of Exercise per Week: Not on file  . Minutes of Exercise per Session: Not on file  Stress:   . Feeling of Stress : Not on file  Social Connections:   . Frequency of Communication with Friends and Family: Not on file  . Frequency of Social Gatherings with Friends and Family: Not on file  . Attends Religious Services: Not on file  . Active Member of Clubs or Organizations: Not on file  . Attends Banker Meetings: Not on file  . Marital Status: Not on file    Allergies: No Known Allergies  Outpatient Meds: Current Outpatient Medications  Medication Sig Dispense Refill  . FIBER ADULT GUMMIES PO Take 1 tablet by mouth every other day.    . Lactobacillus (PROBIOTIC ACIDOPHILUS PO) Take 1 tablet by mouth daily.    . Multiple Vitamin (MULTIVITAMIN) tablet Take 1 tablet by mouth daily.    Monica [name] Kitchen PEG-KCl-NaCl-NaSulf-Na Asc-C (PLENVU) 140 g SOLR Take 140 g by mouth as directed. Manufacturer's coupon Universal coupon code:BIN: G6837245; GROUP: [id number]; PCN: [id number]; ID: [id number]; PAY NO MORE $50 1 each 0   No current facility-administered medications for this visit.       ___________________________________________________________________ Objective   Exam:  BP 112/70   Pulse 95   Ht 5\' 4"  (1.626 m)   Wt 125 lb (56.7 kg)   SpO2 99%   BMI 21.46 kg/m    General: Well-appearing, thin but without muscle wasting  Eyes: sclera anicteric, no redness  ENT: oral mucosa moist without lesions, no cervical or supraclavicular lymphadenopathy  CV: RRR without murmur, S1/S2, no JVD, no peripheral edema  Resp: clear to auscultation bilaterally, normal RR and effort noted  GI: soft, no tenderness, with active bowel sounds. No guarding or palpable organomegaly noted.  Skin; warm and dry, no rash or jaundice noted  Neuro: awake, alert and oriented x 3. Normal gross motor function and fluent speech Rectal exam deferred  Data:  She tells me that no imaging has been done of her back or hip  Assessment: Encounter Diagnoses  Name Primary?  . Chronic  constipation Yes  . Weight loss   . Rectal pain   . Rectal bleeding     Several months of worsening constipation with intermittent rectal pain with bowel movements.  Sounds like possibly pelvic floor dysfunction, less likely obstructive.  She is concerned about cancer.  Few pounds of weight loss of unclear significance in relation to the symptoms.  Plan:  Colonoscopy.  She was agreeable after discussion of procedure and risks.  The benefits and risks of the planned procedure were described in detail with the patient or (when appropriate) their health care proxy.  Risks were outlined as including, but not limited to, bleeding, infection, perforation, adverse medication reaction leading to cardiac or pulmonary decompensation, pancreatitis (if ERCP).  The limitation of incomplete mucosal visualization was also discussed.  No guarantees or warranties were given.  Preprocedure, awake rectal exam to assess for possible PFD.  Discussed that with her.  Possible anorectal manometry to follow.  Thank you for  the courtesy of this consult.  Please call me with any questions or concerns.  Charlie Pitter III  CC: Referring provider noted above

## 2020-07-11 ENCOUNTER — Encounter: Payer: Self-pay | Admitting: Gastroenterology

## 2020-07-13 ENCOUNTER — Encounter: Payer: Self-pay | Admitting: Gastroenterology

## 2020-07-13 ENCOUNTER — Other Ambulatory Visit: Payer: Self-pay

## 2020-07-13 ENCOUNTER — Ambulatory Visit (AMBULATORY_SURGERY_CENTER): Payer: BC Managed Care – PPO | Admitting: Gastroenterology

## 2020-07-13 VITALS — BP 93/54 | HR 73 | Temp 98.6°F | Resp 19 | Ht 64.0 in | Wt 125.0 lb

## 2020-07-13 DIAGNOSIS — K5909 Other constipation: Secondary | ICD-10-CM

## 2020-07-13 DIAGNOSIS — B829 Intestinal parasitism, unspecified: Secondary | ICD-10-CM | POA: Diagnosis not present

## 2020-07-13 DIAGNOSIS — K6389 Other specified diseases of intestine: Secondary | ICD-10-CM

## 2020-07-13 MED ORDER — MEBENDAZOLE 100 MG PO CHEW: 100.0000 mg | CHEWABLE_TABLET | Freq: Once | ORAL | 0 refills | Status: AC

## 2020-07-13 MED ORDER — SODIUM CHLORIDE 0.9 % IV SOLN: 500.0000 mL | Freq: Once | INTRAVENOUS | Status: DC

## 2020-07-13 NOTE — Progress Notes (Signed)
Pt's states no medical or surgical changes since previsit or office visit. 

## 2020-07-13 NOTE — Progress Notes (Signed)
A/ox3, pleased with MAC, report to RN 

## 2020-07-13 NOTE — Patient Instructions (Signed)
Resume previous diet Start mebendazole 100mg  tablet once Contact your childrens pediatrician to inform them of the worms Await pathology results Repeat colonscopy in 10 years Will re-evaluate symptoms after treatment  YOU HAD AN ENDOSCOPIC PROCEDURE TODAY AT THE [facility]:   Refer to the procedure report that was given to you for any specific questions about what was found during the examination.  If the procedure report does not answer your questions, please call your gastroenterologist to clarify.  If you requested that your care partner not be given the details of your procedure findings, then the procedure report has been included in a sealed envelope for you to review at your convenience later.  YOU SHOULD EXPECT: Some feelings of bloating in the abdomen. Passage of more gas than usual.  Walking can help get rid of the air that was put into your GI tract during the procedure and reduce the bloating. If you had a lower endoscopy (such as a colonoscopy or flexible sigmoidoscopy) you may notice spotting of blood in your stool or on the toilet paper. If you underwent a bowel prep for your procedure, you may not have a normal bowel movement for a few days.  Please Note:  You might notice some irritation and congestion in your nose or some drainage.  This is from the oxygen used during your procedure.  There is no need for concern and it should clear up in a day or so.  SYMPTOMS TO REPORT IMMEDIATELY:   Following lower endoscopy (colonoscopy or flexible sigmoidoscopy):  Excessive amounts of blood in the stool  Significant tenderness or worsening of abdominal pains  Swelling of the abdomen that is new, acute  Fever of 100F or higher  For urgent or emergent issues, a gastroenterologist can be reached at any hour by calling [phone]. Do not use MyChart messaging for urgent concerns.   DIET:  We do recommend a small meal at first, but then you may proceed to your regular  diet.  Drink plenty of fluids but you should avoid alcoholic beverages for 24 hours.  ACTIVITY:  You should plan to take it easy for the rest of today and you should NOT DRIVE or use heavy machinery until tomorrow (because of the sedation medicines used during the test).    FOLLOW UP: Our staff will call the number listed on your records 48-72 hours following your procedure to check on you and address any questions or concerns that you may have regarding the information given to you following your procedure. If we do not reach you, we will leave a message.  We will attempt to reach you two times.  During this call, we will ask if you have developed any symptoms of COVID 19. If you develop any symptoms (ie: fever, flu-like symptoms, shortness of breath, cough etc.) before then, please call [phone].  If you test positive for Covid 19 in the 2 weeks post procedure, please call and report this information to [phone].    If any biopsies were taken you will be contacted by phone or by letter within the next 1-3 weeks.  Please call us at [phone] if you have not heard about the biopsies in 3 weeks.   SIGNATURES/CONFIDENTIALITY: You and/or your care partner have signed paperwork which will be entered into your electronic medical record.  These signatures attest to the fact that that the information above on your After Visit Summary has been reviewed and is understood.  Full responsibility of the  confidentiality of this discharge information lies with you and/or your care-partner. 

## 2020-07-13 NOTE — Op Note (Addendum)
[facility] Patient Name: Monica [name] Procedure Date: 07/13/2020 2:09 PM MRN: [id number] Endoscopist: Sherilyn Cooter L. Myrtie Neither , MD Age: 39 Referring MD:  Date of Birth: 03/02/1981 Gender: Female Account #: [id number] Procedure:                Colonoscopy Indications:              Generalized abdominal pain, Constipation, Bloating Medicines:                Monitored Anesthesia Care Procedure:                Pre-Anesthesia Assessment:                           - Prior to the procedure, a History and Physical                            was performed, and patient medications and                            allergies were reviewed. The patient's tolerance of                            previous anesthesia was also reviewed. The risks                            and benefits of the procedure and the sedation                            options and risks were discussed with the patient.                            All questions were answered, and informed consent                            was obtained. Prior Anticoagulants: The patient has                            taken no previous anticoagulant or antiplatelet                            agents. ASA Grade Assessment: I - A normal, healthy                            patient. After reviewing the risks and benefits,                            the patient was deemed in satisfactory condition to                            undergo the procedure.                           After obtaining informed consent, the colonoscope  was passed under direct vision. Throughout the                            procedure, the patient's blood pressure, pulse, and                            oxygen saturations were monitored continuously. The                            0022 [employer] was introduced through the                            anus and advanced to the the terminal ileum, with                            identification of the  appendiceal orifice and IC                            valve. The colonoscopy was performed without                            difficulty. The patient tolerated the procedure                            well. The quality of the bowel preparation was                            poor. The terminal ileum, ileocecal valve,                            appendiceal orifice, and rectum were photographed. Scope In: 2:19:35 PM Scope Out: 2:35:28 PM Scope Withdrawal Time: 0 hours 11 minutes 44 seconds  Total Procedure Duration: 0 hours 15 minutes 53 seconds  Findings:                 The (pre-sedation) digital rectal exam findings                            include normal resting but decreased voluntary ST                            and puborectalis contraction. Diminished rectal                            descent with simulated defecation.                           A large amount of opaque, liquid stool was found in                            the entire colon, interfering with visualization.                           Small, white worms were found in the entire colon.  Specimens suctioned into trap and obtained with                            biopsy forceps.                           The exam was otherwise without abnormality on                            direct and retroflexion views. Complications:            No immediate complications. Estimated Blood Loss:     Estimated blood loss: none. Impression:               - Preparation of the colon was poor.                           - Normal resting but decreased voluntary ST and                            puborectalis contraction found on digital rectal                            exam.                           - Stool in the entire examined colon.                           - Intestinal worms.                           - The examination was otherwise normal on direct                            and retroflexion views.                            - No specimens collected. Recommendation:           - Patient has a contact number available for                            emergencies. The signs and symptoms of potential                            delayed complications were discussed with the                            patient. Return to normal activities tomorrow.                            Written discharge instructions were provided to the                            patient.                           -  Resume previous diet.                           - Take Mebendazole 100 mg tablet once. Disp #1                            tablet, RF zero                           Contact your children's pediatrician to inform them                            of this so they can consider testing vs empiric                            treatment.                           - Await pathology results.                           - Repeat colonoscopy in 10 years for screening                            purposes.                           Re-evaluate patent's symptoms after treatment.                            There may also be underlying pelvic floor                            dysfunction or rectocele. Roby Donaway L. Myrtie Neither, MD 07/13/2020 2:52:11 PM This report has been signed electronically.

## 2020-07-13 NOTE — Progress Notes (Signed)
C.W. vital signs. 

## 2020-07-14 ENCOUNTER — Telehealth: Payer: Self-pay

## 2020-07-14 NOTE — Telephone Encounter (Signed)
Per 07/13/20 procedure note, patient is to have RX for Mebendazole 100 mg tablet once, Disp #1, RF - zero. Patient states that this was called in to [facility] on [address], she states that they should have it ready this evening. Patient had no other concerns at the end of the call.

## 2020-07-15 ENCOUNTER — Telehealth: Payer: Self-pay

## 2020-07-15 NOTE — Telephone Encounter (Signed)
°  Follow up Call-  Call back number 07/13/2020  Post procedure Call Back phone  # [phone]  Permission to leave phone message Yes  Some recent data might be hidden     Patient questions:  Do you have a fever, pain , or abdominal swelling? No. Pain Score  0 *  Have you tolerated food without any problems? Yes.    Have you been able to return to your normal activities? Yes.    Do you have any questions about your discharge instructions: Diet   No. Medications  No. Follow up visit  No.  Do you have questions or concerns about your Care? No.  Actions: * If pain score is 4 or above: No action needed, pain <4. 1. Have you developed a fever since your procedure? no  2.   Have you had an respiratory symptoms (SOB or cough) since your procedure? no  3.   Have you tested positive for COVID 19 since your procedure no  4.   Have you had any family members/close contacts diagnosed with the COVID 19 since your procedure?  no   If yes to any of these questions please route to Laverna Peace, RN and Karlton Lemon, RN

## 2020-07-25 ENCOUNTER — Other Ambulatory Visit: Payer: Self-pay

## 2020-07-25 DIAGNOSIS — K5909 Other constipation: Secondary | ICD-10-CM

## 2020-07-25 MED ORDER — MEBENDAZOLE 100 MG PO CHEW: 100.0000 mg | CHEWABLE_TABLET | Freq: Once | ORAL | 0 refills | Status: AC

## 2020-09-02 NOTE — Progress Notes (Signed)
Called patient regarding Anal rectal manometry that is scheduled for 1/10. Pt had been planning to come to the appointment though she was not told she had to have a covid test prior to the manometry. She is unable to go today or tomorrow to have this done. Patient wishes to reschedule. The next time she can come is in February. Test rescheduled for 2/9. She understands that she need to go get a covid test on 2/8 .

## 2020-10-04 ENCOUNTER — Other Ambulatory Visit (HOSPITAL_COMMUNITY)
Admission: RE | Admit: 2020-10-04 | Discharge: 2020-10-04 | Disposition: A | Discharge status: Home / Self Care | Payer: Commercial Managed Care - PPO | Source: Ambulatory Visit | Attending: Gastroenterology | Admitting: Gastroenterology

## 2020-10-04 ENCOUNTER — Ambulatory Visit: Payer: BC Managed Care – PPO | Admitting: Gastroenterology

## 2020-10-04 DIAGNOSIS — Z01812 Encounter for preprocedural laboratory examination: Secondary | ICD-10-CM | POA: Diagnosis present

## 2020-10-04 DIAGNOSIS — Z20822 Contact with and (suspected) exposure to covid-19: Secondary | ICD-10-CM | POA: Insufficient documentation

## 2020-10-04 LAB — SARS CORONAVIRUS 2 (TAT 6-24 HRS): SARS Coronavirus 2: NEGATIVE

## 2020-10-05 ENCOUNTER — Encounter (HOSPITAL_COMMUNITY)
Admission: RE | Disposition: A | Discharge status: Home / Self Care | Payer: Self-pay | Source: Home / Self Care | Attending: Gastroenterology

## 2020-10-05 ENCOUNTER — Ambulatory Visit (HOSPITAL_COMMUNITY)
Admission: RE | Admit: 2020-10-05 | Discharge: 2020-10-05 | Disposition: A | Discharge status: Home / Self Care | Payer: Commercial Managed Care - PPO | Attending: Gastroenterology | Admitting: Gastroenterology

## 2020-10-05 DIAGNOSIS — K5900 Constipation, unspecified: Secondary | ICD-10-CM | POA: Insufficient documentation

## 2020-10-05 DIAGNOSIS — K5902 Outlet dysfunction constipation: Secondary | ICD-10-CM | POA: Diagnosis not present

## 2020-10-05 HISTORY — PX: ANAL RECTAL MANOMETRY: SHX6358

## 2020-10-05 SURGERY — MANOMETRY, ANORECTAL
Anesthesia: Choice

## 2020-10-05 NOTE — Progress Notes (Signed)
Anal manometry done per protocol. Balloon expelled in 20 sec.Pt tolerated test well without distress or complication.

## 2020-10-07 ENCOUNTER — Encounter (HOSPITAL_COMMUNITY): Payer: Self-pay | Admitting: Gastroenterology

## 2020-10-25 DIAGNOSIS — K5902 Outlet dysfunction constipation: Secondary | ICD-10-CM

## 2020-11-03 ENCOUNTER — Telehealth: Payer: Self-pay

## 2020-11-03 ENCOUNTER — Encounter: Payer: Self-pay | Admitting: Gastroenterology

## 2020-11-03 ENCOUNTER — Ambulatory Visit: Payer: Commercial Managed Care - PPO | Admitting: Gastroenterology

## 2020-11-03 VITALS — BP 126/70 | HR 79 | Ht 64.0 in | Wt 125.4 lb

## 2020-11-03 DIAGNOSIS — B80 Enterobiasis: Secondary | ICD-10-CM

## 2020-11-03 DIAGNOSIS — K5902 Outlet dysfunction constipation: Secondary | ICD-10-CM | POA: Diagnosis not present

## 2020-11-03 DIAGNOSIS — K5909 Other constipation: Secondary | ICD-10-CM | POA: Diagnosis not present

## 2020-11-03 MED ORDER — MEBENDAZOLE 100 MG PO CHEW: 100.0000 mg | CHEWABLE_TABLET | Freq: Once | ORAL | 0 refills | Status: AC

## 2020-11-03 NOTE — Telephone Encounter (Signed)
Office visit from 11/03/20 faxed via epic to Doristine Mango, NP at [facility] at [phone].

## 2020-11-03 NOTE — Progress Notes (Signed)
[facility] Progress Note  Chief Complaint: Chronic constipation  Subjective  History: I saw Monica [name] for chronic constipation on 05/31/2020, and she underwent a colonoscopy on November 17.  No obstructive cause of constipation seen, and I recommended anorectal manometry.  However, she was also found to have pinworm at the time of the colonoscopy, confirmed on pathology specimen.  She was treated with a dose of mebendazole and instructed to contact their primary care physician including pediatrician and had the family treated and/or tested as well. I also prescribed a second dose of mebendazole to take 2 weeks after the first dose.  Colonoscopy findings as follows: The (pre-sedation) digital rectal exam findings include normal resting but decreased voluntary ST and puborectalis contraction. Diminished rectal descent with simulated defecation. Findings: - A large amount of opaque, liquid stool was found in the entire colon, interfering with visualization. - Small, white worms were found in the entire colon. Specimens suctioned into trap and obtained with biopsy forceps. - The exam was otherwise without abnormality on direct and retroflexion views.  _______________________________________   Monica [name] was glad to report that her constipation is significantly improved in the last couple of months.  She has been eating diet with plenty of fiber and increasing her water intake, but says that was not a big change from before.  For whatever reason, she is now able to have a bowel movement once or twice a day without straining. 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  She has seen worms in her bowel movements, and knows that her children have had the same.  Her son was up all night this week with anal pruritus.Monica [name] says the children received 1 dose of antihelminthic therapy from their pediatrician and her husband was treated as well.  However, she is concerned that  there still might be infection in the house despite extensive cleaning.  She is heading to the pediatrician's office today to bring this to their attention.  ROS: Cardiovascular:  no chest pain Respiratory: no dyspnea  The patient's Past Medical, Family and Social History were reviewed and are on file in the EMR.  Objective:  Med list reviewed No current outpatient medications on file.   Vital signs in last 24 hrs: Vitals:   11/03/20 0908  BP: 126/70  Pulse: 79  SpO2: 99%   Wt Readings from Last 3 Encounters:  11/03/20 125 lb 6.4 oz (56.9 kg)  07/13/20 125 lb (56.7 kg)  05/31/20 125 lb (56.7 kg)     Remainder of exam not performed.  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  _____________________________________________ Assessment & Plan  Assessment: Encounter Diagnoses  Name Primary?  . Chronic constipation Yes  . Dyssynergic defecation   . Pinworm infection    ARM proven dyssynergic defecation.  She is better lately, perhaps with greater awareness of this condition.  We talked about dietary and bowel habit maneuvers such as regular toilet schedule and elevating the feet.  She has recurrent pinworm infection, and I prescribed her 2 doses of mebendazole 100 mg, 1 to take immediately and another in 2 weeks.  However, clearance of that requires appropriate  treatment of the entire family and perhaps additional deep cleaning of their living space.  I encouraged her to work with their pediatrician and  primary care provider on this.  Infectious disease consultation can also be obtained as needed.  Monica [name] will otherwise see me as needed.  20 minutes were spent on this encounter (including chart review, history/exam, counseling/coordination of care, and documentation) > 50% of that time was spent on counseling and coordination of care.  Topics discussed included: Chronic constipation, pelvic floor dysfunction and pinworm infection.  Charlie Pitter III

## 2020-11-03 NOTE — Telephone Encounter (Signed)
-----   Message from Charlie Pitter III, MD sent at 11/03/2020  1:12 PM EST ----- I tried to route this patient's note to her PCP, Doristine Mango, NP.  However, epic does not have a means to do that for her (it may not have her fax number). Please look up that practices fax number and manually fax the report to them.  - HD

## 2020-11-03 NOTE — Patient Instructions (Addendum)
If you are age 40 or older, your body mass index should be between 23-30. Your Body mass index is 21.52 kg/m. If this is out of the aforementioned range listed, please consider follow up with your Primary Care Provider.  If you are age 44 or younger, your body mass index should be between 19-25. Your Body mass index is 21.52 kg/m. If this is out of the aformentioned range listed, please consider follow up with your Primary Care Provider.   START Mebendazole 100 mg. This has been sent to your pharmacy.  Follow up as needed.  It was a pleasure to see you today!  Dr. Myrtie Neither

## 2021-10-24 ENCOUNTER — Encounter (HOSPITAL_COMMUNITY): Payer: Self-pay

## 2021-10-24 ENCOUNTER — Ambulatory Visit (HOSPITAL_COMMUNITY)
Admission: EM | Admit: 2021-10-24 | Discharge: 2021-10-24 | Disposition: A | Discharge status: Home / Self Care | Payer: BC Managed Care – PPO | Attending: Emergency Medicine | Admitting: Emergency Medicine

## 2021-10-24 DIAGNOSIS — J020 Streptococcal pharyngitis: Secondary | ICD-10-CM

## 2021-10-24 LAB — POCT RAPID STREP A, ED / UC: Streptococcus, Group A Screen (Direct): POSITIVE — AB

## 2021-10-24 MED ORDER — AMOXICILLIN 500 MG PO CAPS: 500.0000 mg | ORAL_CAPSULE | Freq: Two times a day (BID) | ORAL | 0 refills | Status: AC

## 2021-10-24 NOTE — Discharge Instructions (Signed)
Your rapid strep test today was positive  Take amoxicillin twice a day for the next 10 days he should start to see improvement in the next 48 hours  May attempt use of salt water gargles, throat lozenges, warm teas, honey for additional support  May use over-the-counter Tylenol and ibuprofen for additional pain control   You may follow-up at urgent care as needed

## 2021-10-24 NOTE — ED Provider Notes (Signed)
[facility]    CSN: [id number] Arrival date & time: 10/24/21  1820      History   Chief Complaint Chief Complaint  Patient presents with   Sore Throat    HPI Monica [name] is a 41 y.o. female.   Patient presents with sore throat, intermittent right-sided ear pain and a nonproductive cough for 5 days.  Decreased appetite but tolerating fluids.  Known sick contact in household with strep.  Has attempted use of cough drops, honey and tea which have been ineffective.  No pertinent medical history.  Past Medical History:  Diagnosis Date   H. pylori infection    early 20's   Hx of varicella    Urinary tract disease     Patient Active Problem List   Diagnosis Date Noted   Constipation due to outlet dysfunction    Dyssynergic defecation    Indication for care in labor or delivery 02/18/2019   NSVD (normal spontaneous vaginal delivery) 02/18/2019   Pregnancy 06/08/2015   SVD (spontaneous vaginal delivery) 06/08/2015    Past Surgical History:  Procedure Laterality Date   ANAL RECTAL MANOMETRY N/A 10/05/2020   Procedure: ANO RECTAL MANOMETRY;  Surgeon: Sherrilyn Rist, MD;  Location: [facility];  Service: Gastroenterology;  Laterality: N/A;   WISDOM TOOTH EXTRACTION      OB History     Gravida  2   Para  2   Term  2   Preterm      AB      Living  2      SAB      IAB      Ectopic      Multiple  0   Live Births  2            Home Medications    Prior to Admission medications   Medication Sig Start Date End Date Taking? Authorizing Provider  ranitidine (ZANTAC) 150 MG tablet Take 1 tablet (150 mg total) by mouth 2 (two) times daily. Patient not taking: No sig reported 02/26/18 01/07/20  Arnaldo Natal, MD    Family History Family History  Problem Relation Age of Onset   Breast cancer Mother    Hyperlipidemia Father    Hypertension Father    Varicose Veins Paternal Grandmother    Lung cancer Paternal Grandmother     Healthy Brother    Esophageal cancer Neg Hx    Colon cancer Neg Hx    Stomach cancer Neg Hx    Liver disease Neg Hx    Pancreatic cancer Neg Hx     Social History Social History   Tobacco Use   Smoking status: Never   Smokeless tobacco: Never  Vaping Use   Vaping Use: Never used  Substance Use Topics   Alcohol use: Yes    Comment: occ glass of wine    Drug use: Never     Allergies   Patient has no known allergies.   Review of Systems Review of Systems  Constitutional: Negative.   HENT:  Positive for ear pain and sore throat. Negative for congestion, dental problem, drooling, ear discharge, facial swelling, hearing loss, mouth sores, nosebleeds, postnasal drip, rhinorrhea, sinus pressure, sinus pain, sneezing, tinnitus, trouble swallowing and voice change.   Respiratory:  Positive for cough. Negative for apnea, choking, chest tightness, shortness of breath, wheezing and stridor.   Cardiovascular: Negative.   Skin: Negative.   Neurological: Negative.     Physical Exam Triage Vital  Signs ED Triage Vitals  Enc Vitals Group     BP 10/24/21 1920 121/80     Pulse Rate 10/24/21 1920 75     Resp 10/24/21 1920 18     Temp 10/24/21 1920 98.4 F (36.9 C)     Temp Source 10/24/21 1920 Oral     SpO2 10/24/21 1920 98 %     Weight --      Height --      Head Circumference --      Peak Flow --      Pain Score 10/24/21 1919 7     Pain Loc --      Pain Edu? --      Excl. in GC? --    No data found.  Updated Vital Signs BP 121/80 (BP Location: Right Arm)    Pulse 75    Temp 98.4 F (36.9 C) (Oral)    Resp 18    LMP 10/20/2021    SpO2 98%   Visual Acuity Right Eye Distance:   Left Eye Distance:   Bilateral Distance:    Right Eye Near:   Left Eye Near:    Bilateral Near:     Physical Exam Constitutional:      Appearance: She is well-developed.  HENT:     Head: Normocephalic.     Right Ear: Tympanic membrane and ear canal normal.     Left Ear: Tympanic membrane  and ear canal normal.     Nose: No congestion or rhinorrhea.     Mouth/Throat:     Mouth: Mucous membranes are moist.     Pharynx: Posterior oropharyngeal erythema present.     Tonsils: No tonsillar exudate. 1+ on the right. 2+ on the left.  Cardiovascular:     Rate and Rhythm: Normal rate and regular rhythm.     Heart sounds: Normal heart sounds.  Pulmonary:     Effort: Pulmonary effort is normal.     Breath sounds: Normal breath sounds.  Musculoskeletal:     Cervical back: Normal range of motion.  Lymphadenopathy:     Cervical: Cervical adenopathy present.  Skin:    General: Skin is warm and dry.  Neurological:     General: No focal deficit present.     Mental Status: She is alert and oriented to person, place, and time.  Psychiatric:        Mood and Affect: Mood normal.        Behavior: Behavior normal.     UC Treatments / Results  Labs (all labs ordered are listed, but only abnormal results are displayed) Labs Reviewed  POCT RAPID STREP A, ED / UC    EKG   Radiology No results found.  Procedures Procedures (including critical care time)  Medications Ordered in UC Medications - No data to display  Initial Impression / Assessment and Plan / UC Course  I have reviewed the triage vital signs and the nursing notes.  Pertinent labs & imaging results that were available during my care of the patient were reviewed by me and considered in my medical decision making (see chart for details).  Strep pharyngitis  Confirmed by rapid test, discussed findings with patient, amoxicillin 10-day course prescribed, may continue use of throat lozenges, warm teas, honey, salt water gargles, may use over-the-counter Tylenol or ibuprofen for pain management, urgent care follow-up as needed for persistent symptoms Final Clinical Impressions(s) / UC Diagnoses   Final diagnoses:  None   Discharge Instructions  None    ED Prescriptions   None    PDMP not reviewed this  encounter.   Valinda Hoar, NP 10/24/21 1952

## 2021-10-24 NOTE — ED Triage Notes (Signed)
Pt c/o sore throat since Saturday, her child has strep.
